# Patient Record
Sex: Male | Born: 1985 | Race: White | Hispanic: No | Marital: Married | Smoking: Former smoker
Health system: Southern US, Community
[De-identification: ages and names within clinical notes are randomized; demographics above are authoritative.]

## PROBLEM LIST (undated history)

## (undated) DIAGNOSIS — C801 Malignant (primary) neoplasm, unspecified: Secondary | ICD-10-CM

## (undated) DIAGNOSIS — Z923 Personal history of irradiation: Secondary | ICD-10-CM

## (undated) DIAGNOSIS — Z8672 Personal history of thrombophlebitis: Secondary | ICD-10-CM

## (undated) DIAGNOSIS — M352 Behcet's disease: Secondary | ICD-10-CM

## (undated) DIAGNOSIS — M109 Gout, unspecified: Secondary | ICD-10-CM

## (undated) DIAGNOSIS — C6211 Malignant neoplasm of descended right testis: Secondary | ICD-10-CM

## (undated) HISTORY — PX: APPENDECTOMY: SHX54

## (undated) HISTORY — DX: Gout, unspecified: M10.9

## (undated) HISTORY — DX: Behcet's disease: M35.2

## (undated) HISTORY — DX: Personal history of irradiation: Z92.3

---
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
## 2006-04-28 ENCOUNTER — Ambulatory Visit: Payer: Self-pay | Admitting: Family Medicine

## 2006-04-28 LAB — CONVERTED CEMR LAB
BUN: 3 mg/dL — ABNORMAL LOW (ref 6–23)
Basophils Absolute: 0 10*3/uL (ref 0.0–0.1)
Basophils Relative: 0.1 % (ref 0.0–1.0)
Blood Glucose, Fasting: 89 mg/dL
CO2: 30 meq/L (ref 19–32)
Calcium: 9.6 mg/dL (ref 8.4–10.5)
Chloride: 107 meq/L (ref 96–112)
Creatinine, Ser: 0.9 mg/dL (ref 0.4–1.5)
Eosinophils Absolute: 0.1 10*3/uL (ref 0.0–0.6)
Eosinophils Relative: 1.4 % (ref 0.0–5.0)
GFR calc Af Amer: 140 mL/min
GFR calc non Af Amer: 116 mL/min
Glucose, Bld: 89 mg/dL (ref 70–99)
HCT: 47.9 % (ref 39.0–52.0)
Hemoglobin: 16.2 g/dL (ref 13.0–17.0)
Lymphocytes Relative: 13.6 % (ref 12.0–46.0)
MCHC: 33.8 g/dL (ref 30.0–36.0)
MCV: 81.4 fL (ref 78.0–100.0)
Monocytes Absolute: 0.8 10*3/uL — ABNORMAL HIGH (ref 0.2–0.7)
Monocytes Relative: 7.9 % (ref 3.0–11.0)
Neutro Abs: 8.2 10*3/uL — ABNORMAL HIGH (ref 1.4–7.7)
Neutrophils Relative %: 77 % (ref 43.0–77.0)
Platelets: 244 10*3/uL (ref 150–400)
Potassium: 4.1 meq/L (ref 3.5–5.1)
RBC count: 5.89 10*6/uL
RBC: 5.89 M/uL — ABNORMAL HIGH (ref 4.22–5.81)
RDW: 12.1 % (ref 11.5–14.6)
Sodium: 142 meq/L (ref 135–145)
TSH: 0.39 microintl units/mL
TSH: 0.39 microintl units/mL (ref 0.35–5.50)
WBC, blood: 10.5 10*3/uL
WBC: 10.5 10*3/uL (ref 4.5–10.5)

## 2006-12-03 ENCOUNTER — Ambulatory Visit: Payer: Self-pay | Admitting: Family Medicine

## 2006-12-03 ENCOUNTER — Encounter (INDEPENDENT_AMBULATORY_CARE_PROVIDER_SITE_OTHER): Payer: Self-pay | Admitting: Internal Medicine

## 2006-12-08 ENCOUNTER — Telehealth (INDEPENDENT_AMBULATORY_CARE_PROVIDER_SITE_OTHER): Payer: Self-pay | Admitting: Internal Medicine

## 2006-12-08 LAB — CONVERTED CEMR LAB: Herpes Simplex Vrs I&II-IgM Ab (EIA): NOT DETECTED

## 2006-12-09 ENCOUNTER — Encounter: Payer: Self-pay | Admitting: Family Medicine

## 2006-12-09 ENCOUNTER — Encounter (INDEPENDENT_AMBULATORY_CARE_PROVIDER_SITE_OTHER): Payer: Self-pay | Admitting: Internal Medicine

## 2007-04-09 ENCOUNTER — Emergency Department: Payer: Self-pay | Admitting: Emergency Medicine

## 2007-04-13 ENCOUNTER — Ambulatory Visit: Payer: Self-pay | Admitting: Family Medicine

## 2007-04-14 ENCOUNTER — Telehealth: Payer: Self-pay | Admitting: Family Medicine

## 2007-04-19 ENCOUNTER — Ambulatory Visit: Payer: Self-pay | Admitting: Family Medicine

## 2007-04-20 ENCOUNTER — Encounter: Payer: Self-pay | Admitting: Family Medicine

## 2007-04-20 DIAGNOSIS — J309 Allergic rhinitis, unspecified: Secondary | ICD-10-CM | POA: Insufficient documentation

## 2007-04-20 DIAGNOSIS — M4120 Other idiopathic scoliosis, site unspecified: Secondary | ICD-10-CM | POA: Insufficient documentation

## 2007-07-15 ENCOUNTER — Ambulatory Visit: Payer: Self-pay | Admitting: Family Medicine

## 2007-07-15 DIAGNOSIS — S61209A Unspecified open wound of unspecified finger without damage to nail, initial encounter: Secondary | ICD-10-CM | POA: Insufficient documentation

## 2007-08-31 ENCOUNTER — Ambulatory Visit: Payer: Self-pay | Admitting: Family Medicine

## 2007-08-31 DIAGNOSIS — M352 Behcet's disease: Secondary | ICD-10-CM | POA: Insufficient documentation

## 2007-09-02 ENCOUNTER — Telehealth: Payer: Self-pay | Admitting: Family Medicine

## 2007-09-02 ENCOUNTER — Encounter: Payer: Self-pay | Admitting: Family Medicine

## 2007-09-06 ENCOUNTER — Telehealth: Payer: Self-pay | Admitting: Family Medicine

## 2007-09-08 ENCOUNTER — Telehealth (INDEPENDENT_AMBULATORY_CARE_PROVIDER_SITE_OTHER): Payer: Self-pay | Admitting: Internal Medicine

## 2007-09-23 ENCOUNTER — Telehealth: Payer: Self-pay | Admitting: Family Medicine

## 2007-10-12 ENCOUNTER — Encounter: Payer: Self-pay | Admitting: Family Medicine

## 2008-02-08 ENCOUNTER — Encounter: Payer: Self-pay | Admitting: Family Medicine

## 2008-05-11 ENCOUNTER — Telehealth: Payer: Self-pay | Admitting: Family Medicine

## 2008-05-12 ENCOUNTER — Ambulatory Visit: Payer: Self-pay | Admitting: Family Medicine

## 2008-05-12 ENCOUNTER — Telehealth: Payer: Self-pay | Admitting: Family Medicine

## 2008-05-12 DIAGNOSIS — R17 Unspecified jaundice: Secondary | ICD-10-CM | POA: Insufficient documentation

## 2008-05-12 LAB — CONVERTED CEMR LAB
Haptoglobin: 151 mg/dL (ref 16–200)
LDH: 119 units/L (ref 94–250)

## 2008-05-15 LAB — CONVERTED CEMR LAB
ALT: 18 units/L (ref 0–53)
AST: 16 units/L (ref 0–37)
Albumin: 3.8 g/dL (ref 3.5–5.2)
Alkaline Phosphatase: 71 units/L (ref 39–117)
BUN: 8 mg/dL (ref 6–23)
Basophils Absolute: 0 10*3/uL (ref 0.0–0.1)
Basophils Relative: 0.1 % (ref 0.0–3.0)
Bilirubin, Direct: 0.2 mg/dL (ref 0.0–0.3)
CO2: 31 meq/L (ref 19–32)
Calcium: 8.7 mg/dL (ref 8.4–10.5)
Chloride: 105 meq/L (ref 96–112)
Creatinine, Ser: 0.9 mg/dL (ref 0.4–1.5)
Eosinophils Absolute: 0.2 10*3/uL (ref 0.0–0.7)
Eosinophils Relative: 2.8 % (ref 0.0–5.0)
GFR calc Af Amer: 137 mL/min
GFR calc non Af Amer: 113 mL/min
Glucose, Bld: 79 mg/dL (ref 70–99)
HCT: 45.1 % (ref 39.0–52.0)
Hemoglobin: 15.5 g/dL (ref 13.0–17.0)
Lymphocytes Relative: 24.8 % (ref 12.0–46.0)
MCHC: 34.4 g/dL (ref 30.0–36.0)
MCV: 83 fL (ref 78.0–100.0)
Monocytes Absolute: 0.7 10*3/uL (ref 0.1–1.0)
Monocytes Relative: 11.4 % (ref 3.0–12.0)
Neutro Abs: 3.5 10*3/uL (ref 1.4–7.7)
Neutrophils Relative %: 60.9 % (ref 43.0–77.0)
Platelets: 233 10*3/uL (ref 150–400)
Potassium: 3.9 meq/L (ref 3.5–5.1)
RBC: 5.44 M/uL (ref 4.22–5.81)
RDW: 12.6 % (ref 11.5–14.6)
Sodium: 142 meq/L (ref 135–145)
Total Bilirubin: 1.6 mg/dL — ABNORMAL HIGH (ref 0.3–1.2)
Total Protein: 6.5 g/dL (ref 6.0–8.3)
WBC: 5.8 10*3/uL (ref 4.5–10.5)

## 2008-05-29 ENCOUNTER — Ambulatory Visit: Payer: Self-pay | Admitting: Family Medicine

## 2008-05-29 LAB — CONVERTED CEMR LAB
ALT: 19 units/L (ref 0–53)
AST: 20 units/L (ref 0–37)
Albumin: 4 g/dL (ref 3.5–5.2)
Alkaline Phosphatase: 81 units/L (ref 39–117)
BUN: 10 mg/dL (ref 6–23)
Basophils Absolute: 0 10*3/uL (ref 0.0–0.1)
Basophils Relative: 0.3 % (ref 0.0–3.0)
Bilirubin, Direct: 0.3 mg/dL (ref 0.0–0.3)
CO2: 31 meq/L (ref 19–32)
CRP, High Sensitivity: 3 (ref 0.00–5.00)
Calcium: 9.4 mg/dL (ref 8.4–10.5)
Chloride: 109 meq/L (ref 96–112)
Cholesterol: 122 mg/dL (ref 0–200)
Creatinine, Ser: 1 mg/dL (ref 0.4–1.5)
Eosinophils Absolute: 0.1 10*3/uL (ref 0.0–0.7)
Eosinophils Relative: 1.9 % (ref 0.0–5.0)
GFR calc non Af Amer: 99.29 mL/min (ref >60)
Glucose, Bld: 95 mg/dL (ref 70–99)
HCT: 45.8 % (ref 39.0–52.0)
HDL: 46.6 mg/dL (ref >39.00)
Hemoglobin: 15.8 g/dL (ref 13.0–17.0)
LDL Cholesterol: 70 mg/dL (ref 0–99)
Lymphocytes Relative: 29.9 % (ref 12.0–46.0)
Lymphs Abs: 2.1 10*3/uL (ref 0.7–4.0)
MCHC: 34.4 g/dL (ref 30.0–36.0)
MCV: 82.5 fL (ref 78.0–100.0)
Monocytes Absolute: 0.6 10*3/uL (ref 0.1–1.0)
Monocytes Relative: 9 % (ref 3.0–12.0)
Neutro Abs: 4.1 10*3/uL (ref 1.4–7.7)
Neutrophils Relative %: 58.9 % (ref 43.0–77.0)
Platelets: 251 10*3/uL (ref 150.0–400.0)
Potassium: 4.2 meq/L (ref 3.5–5.1)
RBC: 5.55 M/uL (ref 4.22–5.81)
RDW: 12.7 % (ref 11.5–14.6)
Sed Rate: 4 mm/hr (ref 0–22)
Sodium: 144 meq/L (ref 135–145)
TSH: 0.68 microintl units/mL (ref 0.35–5.50)
Total Bilirubin: 2 mg/dL — ABNORMAL HIGH (ref 0.3–1.2)
Total CHOL/HDL Ratio: 3
Total Protein: 6.8 g/dL (ref 6.0–8.3)
Triglycerides: 28 mg/dL (ref 0.0–149.0)
VLDL: 5.6 mg/dL (ref 0.0–40.0)
WBC: 6.9 10*3/uL (ref 4.5–10.5)

## 2008-06-01 ENCOUNTER — Ambulatory Visit: Payer: Self-pay | Admitting: Family Medicine

## 2008-06-01 DIAGNOSIS — M25569 Pain in unspecified knee: Secondary | ICD-10-CM

## 2008-07-18 ENCOUNTER — Encounter: Payer: Self-pay | Admitting: Family Medicine

## 2008-08-25 ENCOUNTER — Ambulatory Visit: Payer: Self-pay | Admitting: Family Medicine

## 2008-09-11 ENCOUNTER — Encounter: Payer: Self-pay | Admitting: Family Medicine

## 2008-09-11 ENCOUNTER — Telehealth: Payer: Self-pay | Admitting: Family Medicine

## 2008-10-10 ENCOUNTER — Encounter: Payer: Self-pay | Admitting: Family Medicine

## 2008-10-21 ENCOUNTER — Emergency Department: Payer: Self-pay | Admitting: Emergency Medicine

## 2008-10-25 ENCOUNTER — Ambulatory Visit: Payer: Self-pay | Admitting: Family Medicine

## 2008-10-25 DIAGNOSIS — I808 Phlebitis and thrombophlebitis of other sites: Secondary | ICD-10-CM

## 2008-11-15 ENCOUNTER — Telehealth: Payer: Self-pay | Admitting: Family Medicine

## 2008-11-16 ENCOUNTER — Ambulatory Visit: Payer: Self-pay | Admitting: Internal Medicine

## 2008-11-22 ENCOUNTER — Ambulatory Visit: Payer: Self-pay | Admitting: Family Medicine

## 2008-11-23 ENCOUNTER — Encounter: Payer: Self-pay | Admitting: Family Medicine

## 2008-11-24 ENCOUNTER — Encounter: Payer: Self-pay | Admitting: Family Medicine

## 2009-01-16 ENCOUNTER — Encounter: Payer: Self-pay | Admitting: Family Medicine

## 2009-01-21 ENCOUNTER — Emergency Department: Payer: Self-pay | Admitting: Emergency Medicine

## 2009-02-01 ENCOUNTER — Encounter: Payer: Self-pay | Admitting: Family Medicine

## 2009-03-09 ENCOUNTER — Telehealth: Payer: Self-pay | Admitting: Family Medicine

## 2009-03-19 ENCOUNTER — Encounter (INDEPENDENT_AMBULATORY_CARE_PROVIDER_SITE_OTHER): Payer: Self-pay | Admitting: *Deleted

## 2009-03-19 ENCOUNTER — Ambulatory Visit: Payer: Self-pay | Admitting: Family Medicine

## 2009-03-19 ENCOUNTER — Encounter: Payer: Self-pay | Admitting: Family Medicine

## 2009-03-19 DIAGNOSIS — S60459A Superficial foreign body of unspecified finger, initial encounter: Secondary | ICD-10-CM

## 2009-03-19 DIAGNOSIS — M79609 Pain in unspecified limb: Secondary | ICD-10-CM

## 2009-03-22 ENCOUNTER — Encounter (INDEPENDENT_AMBULATORY_CARE_PROVIDER_SITE_OTHER): Payer: Self-pay | Admitting: *Deleted

## 2009-03-26 ENCOUNTER — Telehealth: Payer: Self-pay | Admitting: Family Medicine

## 2009-04-05 ENCOUNTER — Ambulatory Visit: Payer: Self-pay | Admitting: Family Medicine

## 2009-04-12 ENCOUNTER — Ambulatory Visit: Payer: Self-pay | Admitting: Family Medicine

## 2009-05-08 ENCOUNTER — Encounter: Payer: Self-pay | Admitting: Family Medicine

## 2009-05-08 LAB — CONVERTED CEMR LAB
Basophils Absolute: 0 10*3/uL (ref 0.0–0.1)
Basophils Relative: 0 % (ref 0.0–3.0)
Eosinophils Absolute: 0.1 10*3/uL (ref 0.0–0.7)
Eosinophils Relative: 1.4 % (ref 0.0–5.0)
HCT: 44.2 % (ref 39.0–52.0)
Hemoglobin: 14.2 g/dL (ref 13.0–17.0)
Lymphocytes Relative: 13.6 % (ref 12.0–46.0)
Lymphs Abs: 1.2 10*3/uL (ref 0.7–4.0)
MCHC: 32.1 g/dL (ref 30.0–36.0)
MCV: 82.7 fL (ref 78.0–100.0)
Monocytes Absolute: 0.7 10*3/uL (ref 0.1–1.0)
Monocytes Relative: 7.8 % (ref 3.0–12.0)
Neutro Abs: 6.5 10*3/uL (ref 1.4–7.7)
Neutrophils Relative %: 77.2 % — ABNORMAL HIGH (ref 43.0–77.0)
Platelets: 223 10*3/uL (ref 150.0–400.0)
RBC: 5.34 M/uL (ref 4.22–5.81)
RDW: 13.7 % (ref 11.5–14.6)
WBC: 8.5 10*3/uL (ref 4.5–10.5)

## 2009-06-07 ENCOUNTER — Ambulatory Visit: Payer: Self-pay | Admitting: Family Medicine

## 2009-06-07 ENCOUNTER — Encounter (INDEPENDENT_AMBULATORY_CARE_PROVIDER_SITE_OTHER): Payer: Self-pay | Admitting: *Deleted

## 2009-06-07 DIAGNOSIS — M25469 Effusion, unspecified knee: Secondary | ICD-10-CM

## 2009-06-12 LAB — CONVERTED CEMR LAB
Crystals, Fluid: NONE SEEN
Glucose, Synovial Fluid: 61 mg/dL
Monocyte/Macrophage: 3 % — ABNORMAL LOW (ref 50–90)
Neutrophil, Synovial: 93 % — ABNORMAL HIGH (ref 0–25)
WBC, Synovial: 33445 — ABNORMAL HIGH (ref 0–200)

## 2009-09-17 ENCOUNTER — Ambulatory Visit: Payer: Self-pay | Admitting: Family Medicine

## 2009-09-18 LAB — CONVERTED CEMR LAB
Basophils Absolute: 0 10*3/uL (ref 0.0–0.1)
Basophils Relative: 0.6 % (ref 0.0–3.0)
Eosinophils Absolute: 0.1 10*3/uL (ref 0.0–0.7)
Eosinophils Relative: 2.2 % (ref 0.0–5.0)
HCT: 44 % (ref 39.0–52.0)
Hemoglobin: 15 g/dL (ref 13.0–17.0)
Lymphocytes Relative: 31 % (ref 12.0–46.0)
Lymphs Abs: 1.8 10*3/uL (ref 0.7–4.0)
MCHC: 34 g/dL (ref 30.0–36.0)
MCV: 81.5 fL (ref 78.0–100.0)
Monocytes Absolute: 0.4 10*3/uL (ref 0.1–1.0)
Monocytes Relative: 7.2 % (ref 3.0–12.0)
Neutro Abs: 3.4 10*3/uL (ref 1.4–7.7)
Neutrophils Relative %: 59 % (ref 43.0–77.0)
Platelets: 279 10*3/uL (ref 150.0–400.0)
RBC: 5.41 M/uL (ref 4.22–5.81)
RDW: 14.3 % (ref 11.5–14.6)
WBC: 5.7 10*3/uL (ref 4.5–10.5)

## 2009-10-03 ENCOUNTER — Telehealth: Payer: Self-pay | Admitting: Family Medicine

## 2009-10-11 ENCOUNTER — Ambulatory Visit: Payer: Self-pay | Admitting: Family Medicine

## 2009-10-11 LAB — CONVERTED CEMR LAB
Basophils Absolute: 0 10*3/uL (ref 0.0–0.1)
Basophils Relative: 0.5 % (ref 0.0–3.0)
Eosinophils Absolute: 0.1 10*3/uL (ref 0.0–0.7)
Eosinophils Relative: 1.7 % (ref 0.0–5.0)
HCT: 45.1 % (ref 39.0–52.0)
Hemoglobin: 15.5 g/dL (ref 13.0–17.0)
Lymphocytes Relative: 21.3 % (ref 12.0–46.0)
Lymphs Abs: 1.8 10*3/uL (ref 0.7–4.0)
MCHC: 34.4 g/dL (ref 30.0–36.0)
MCV: 81.8 fL (ref 78.0–100.0)
Monocytes Absolute: 0.5 10*3/uL (ref 0.1–1.0)
Monocytes Relative: 5.6 % (ref 3.0–12.0)
Neutro Abs: 5.9 10*3/uL (ref 1.4–7.7)
Neutrophils Relative %: 70.9 % (ref 43.0–77.0)
Platelets: 259 10*3/uL (ref 150.0–400.0)
RBC: 5.51 M/uL (ref 4.22–5.81)
RDW: 14.4 % (ref 11.5–14.6)
WBC: 8.4 10*3/uL (ref 4.5–10.5)

## 2009-12-14 ENCOUNTER — Ambulatory Visit: Payer: Self-pay | Admitting: Family Medicine

## 2009-12-14 DIAGNOSIS — M25539 Pain in unspecified wrist: Secondary | ICD-10-CM | POA: Insufficient documentation

## 2010-04-02 NOTE — Assessment & Plan Note (Signed)
Summary: fell on wrist/dlo   Vital Signs:  Patient profile:   25 year old male Height:      69 inches Weight:      152.25 pounds BMI:     22.56 Temp:     98.7 degrees F oral Pulse rate:   96 / minute Pulse rhythm:   regular BP sitting:   110 / 72  (left arm) Cuff size:   regular  Vitals Entered By: Lewanda Rife LPN (December 14, 2009 3:28 PM) CC: fell on left wrist this am at 4:30 am and again at 9:30am. Pain level now is 4.   History of Present Illness: Fell onto L wrist this AM.  FOOSH.  Pain at that point.  Larey Seat again at work today, [employer] again, increase pain since then.  Pain is 4/10 now.  Pain near distal ulna, worse with activity. Had been icing it down.    decrease in range of motion , flex/ext/ulnar deviation due to pain.   Allergies: 1)  ! Morphine  Review of Systems       See HPI.  Otherwise negative.    Physical Exam  General:  NAD normal range of motion at elbow.  L wrist with tenderness on flex/ext/ulnar deviation.  tender to palpation distally on ulna, no snuffbox tenderness. distally NV intact and able to make composite fist.    Impression & Recommendations:  Problem # 1:  WRIST PAIN, LEFT (ICD-719.43) Xrays neg.  Feels better in brace.  Work note given for light duty and allowed to wear brace.  follow up as needed- he is aware for possible occult fx that isn't visible on initial films.  Sedation caution given for meds.  Orders: T-Wrist Comp Left Min 3 Views ([id number])  Complete Medication List: 1)  Eq Ibuprofen 200 Mg Caps (Ibuprofen) .... As needed 2)  Allegra 180 Mg Tabs (Fexofenadine hcl) .... Take 1 tablet by mouth once a day as needed 3)  Azathioprine 50 Mg Tabs (Azathioprine) .... Take three by mouth daily 4)  Vicodin 5-500 Mg Tabs (Hydrocodone-acetaminophen) .Marland Kitchen.. 1-2 by mouth three times a day as needed pain with sedation caution  Patient Instructions: 1)  follow up as needed. Pt agrees.   Current Allergies (reviewed today): ! MORPHINE

## 2010-04-02 NOTE — Progress Notes (Signed)
Summary: time for blood work?  Phone Note Call from Patient Call back at [facility] Phone [phone]   Caller: Patient Call For: Dr. Dayton Martes Summary of Call: Pt is asking if it is time for him to get his blood count checked.  He said his doctor in [place] increased his dosage on his azathioprine about a week ago. Initial call taken by: Lowella Petties CMA,  October 03, 2009 12:15 PM  Follow-up for Phone Call        yes please recheck CBC (282.03) Ruthe Mannan MD  October 03, 2009 1:48 PM  Advised pt, lab appt made. Follow-up by: Lowella Petties CMA,  October 03, 2009 4:19 PM

## 2010-04-02 NOTE — Progress Notes (Signed)
Summary: Chest pain  Phone Note Call from Patient Call back at [phone]   Caller: Patient Call For: Shaune Leeks MD Summary of Call: Patient fell last week on ice and came in and saw Dr. Patsy Lager and the main problem at that time was his thumb. Patient states that his left side hurts just below the left breast and around into the back, area hurts when he breathes, any lifting with the left arm hurts, no SOB. The pain has been gradually getting worse each day and even hurts when he walks today. Patient has an appt. scheduled with Dr. Milinda Antis on Wednesday, but patient wants to know what he should do until that appt? Initial call taken by: Sydell Axon LPN,  March 26, 2009 1:05 PM  Follow-up for Phone Call        Because of his Behcet's and prior concern over PE, think he should be seen at [facility] ER to insure not clotting problem. I would not wait until Wed. Follow-up by: Shaune Leeks MD,  March 26, 2009 1:14 PM  Additional Follow-up for Phone Call Additional follow up Details #1::        Patient notified as instructed by telephone. Patient said that he will go to [facility] ER now.  Additional Follow-up by: Sydell Axon LPN,  March 26, 2009 1:18 PM

## 2010-04-02 NOTE — Assessment & Plan Note (Signed)
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  the patient presents with a known  rheumatological disorder, noted above, with a right knee effusion after no known traumatic incident. He has had no accident. He is not twisted his knee or had any other interval problem including no trauma.  He is never had a knee effusion such as this. He is having some mild pain, primarily with forced flexion. The knee is not hot, it is not red.  He does have a few pimples on his right leg.  Recently saw Dr. Stoney Bang 1 month ago.   Allergies: 1)  ! Morphine  Past History:  Past medical, surgical, family and social histories (including risk factors) reviewed, and no changes noted (except as noted below).  Past Medical History: Reviewed history from 05/12/2008 and no changes required. Allergic rhinitis(:11/1997) Behcet's syndrome  Past Surgical History: Reviewed history from 04/20/2007 and no changes required. BROKEN NOSE PLAYING BASEBALL ECHO--NORMAL:(12/1997) ADNEOIDS REMOVED  71YEARS OF ZOX:(0960) APPY 10 AVW:(0981) JAW SURGERY FOR OVERBITE :(02/2004) FAMILY HISTORY OF CARDIOMYOPATHY (FATHER)  Family History: Reviewed history from 06/01/2008 and no changes required. Father: A  46 Mother: A 58 BROTHER A ([name]) 23 SISTER A ([name]) 16 Brother A [name] 11 CV: +PGF MI HBP: +PGF DM: +MGM/ +MGF// +PGM// +PGF GOUT/ARTHRITIS: PROSTATE CANCER:  BREAST/OVARIAN/UTERINE CANCER:NEGATIVE COLON CANCER: DEPRESSION: NEGATIVE ETOH/DRUG ABUSE:  NEGATIVE OTHER: NEGATIVE STROKE  Social History: Reviewed history from 06/01/2008 and no changes required. Marital Status: Single Last semester at [employer] Children: 0 Occupation: Engineer, technical sales, Cook/server  Review of Systems       REVIEW OF SYSTEMS  GEN: No systemic complaints, no fevers, chills, sweats, or other acute illnesses MSK: Detailed in the HPI GI: tolerating PO intake without difficulty Neuro: No numbness, parasthesias, or tingling associated. Otherwise the pertinent positives of the ROS are noted above.    Physical Exam  General:  GEN: Well-developed,well-nourished,in no acute distress; alert,appropriate and cooperative throughout examination HEENT: Normocephalic and atraumatic without obvious abnormalities. No apparent alopecia or balding. Ears, externally no deformities PULM: Breathing comfortably in no respiratory distress EXT: No clubbing, cyanosis, or edema PSYCH: Normally interactive. Cooperative during the interview. Pleasant. Friendly and conversant. Not anxious or depressed appearing. Normal, full affect.  Msk:  L knee: full ROM, no effusion  R knee: nontender to palpation throughout.  Large ballotable effusion. Nontender along the tibia and fibula. Nontender with patellar facet loading.  Stable to varus and valgus stress. Negative Lachman. Negative anterior and posterior drawer test.  Meniscal testing is equivocal given large effusion.  Negative bounce home test.  No redness or warmth.   Impression & Recommendations:  Problem # 1:  JOINT EFFUSION, RIGHT KNEE (ICD-719.06) Assessment New  large effusion, unclear cause. 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  We'll aspirate joint fluid to rule out potential infection.  At this point the Gram stain is  negative. With appearance of joint aspirate suspect rheum cause, less likely gout, cannot rule out ID, but does not clinically appear to have septic joint.  If culture is negative, will use oral  prednisone taper and have him f/u with [facility]  Knee Aspiration and Injection, RIGHT Patient verbally consented; risks, benefits, and alternatives explained. Patient prepped with betadine. Ethyl chloride for anesthesia. 10 cc of 1% Lidocaine used in wheal then injected Subcutaneous fashion with 27 gauge needle on lateral approach. Under sterilne conditions, 18 gauge needle used via lateral approach to aspirate 25 cc of cloudy, yellowish fluid. Then 9 cc of Marcaine 0.5% and 1 cc of Kenalog 40 mg injected. Tolerated well, decreased pain, no complications.   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 Laboratory test [phone]) Joint Aspirate / Injection, Large (20610)  Complete Medication List: 1)  Eq Ibuprofen 200 Mg Caps (Ibuprofen) .... As needed 2)  Allegra 180 Mg Tabs (Fexofenadine hcl) .... Take 1 tablet by mouth once a day 3)  Azathioprine 50 Mg Tabs (Azathioprine) .... Take one by mouth daily  Current Allergies (reviewed today): ! MORPHINE

## 2010-04-02 NOTE — Letter (Signed)
Summary: Dr.Norton [facility] Rheumatology,Note  Dr.Norton [facility] Rheumatology,Note   Imported By: Beau Fanny 05/10/2009 15:03:42  _____________________________________________________________________  External Attachment:    Type:   Image     Comment:   External Document

## 2010-04-02 NOTE — Assessment & Plan Note (Signed)
Summary: 10:15 FELL AND PIECE OF WOOD IN HAND/RI   Vital Signs:  Patient profile:   25 year old male Height:      69 inches Weight:      143 pounds BMI:     21.19 Temp:     98.2 degrees F oral Pulse rate:   60 / minute Pulse rhythm:   regular BP sitting:   120 / 78  (left arm) Cuff size:   regular  Vitals Entered By: Daniel Vang CMA Daniel Vang) (March 19, 2009 10:21 AM)  History of Present Illness: Chief complaint fell and got a piece of wood in thumb  R thumb, foreign body  25 year old healthy gentleman who presents after falling and having a large piece of wood go through the volar aspect of his first digit. This is angled and entered approximately at the I P. joint, going towards the distal end. He is having minimal pain at this point. Sensation is intact.  Td 2008   Allergies: 1)  ! Morphine  Past History:  Past medical, surgical, family and social histories (including risk factors) reviewed, and no changes noted (except as noted below).  Past Medical History: Reviewed history from 05/12/2008 and no changes required. Allergic rhinitis(:11/1997) Behcet's syndrome  Past Surgical History: Reviewed history from 04/20/2007 and no changes required. BROKEN NOSE PLAYING BASEBALL ECHO--NORMAL:(12/1997) ADNEOIDS REMOVED  28YEARS OF ZOX:(0960) APPY 10 AVW:(0981) JAW SURGERY FOR OVERBITE :(02/2004) FAMILY HISTORY OF CARDIOMYOPATHY (FATHER)  Family History: Reviewed history from 06/01/2008 and no changes required. Father: A  39 Mother: A 57 BROTHER A (Daniel Vang) 23 SISTER A (Daniel Vang) 16 Brother A [name] 11 CV: +PGF MI HBP: +PGF DM: +MGM/ +MGF// +PGM// +PGF GOUT/ARTHRITIS: PROSTATE CANCER:  BREAST/OVARIAN/UTERINE CANCER:NEGATIVE COLON CANCER: DEPRESSION: NEGATIVE ETOH/DRUG ABUSE: NEGATIVE OTHER: NEGATIVE STROKE  Social History: Reviewed history from 06/01/2008 and no changes required. Marital Status: Single Last semester at [employer] Children: 0 Occupation: Games developer, Cook/server  Review of Systems       REVIEW OF SYSTEMS  GEN: No systemic complaints, no fevers, chills, sweats, or other acute illnesses MSK: Detailed in the HPI GI: tolerating PO intake without difficulty Neuro: No numbness, parasthesias, or tingling associated. Otherwise the pertinent positives of the ROS are noted above.    Physical Exam  General:  GEN: Well-developed,well-nourished,in no acute distress; alert,appropriate and cooperative throughout examination HEENT: Normocephalic and atraumatic without obvious abnormalities. No apparent alopecia or balding. Ears, externally no deformities PULM: Breathing comfortably in no respiratory distress EXT: No clubbing, cyanosis, or edema PSYCH: Normally interactive. Cooperative during the interview. Pleasant. Friendly and conversant. Not anxious or depressed appearing. Normal, full affect.  Msk:  Full ROM at 1st digit Extremities:  Large piece of wood, R 1st volar aspect   Impression & Recommendations:  Problem # 1:  THUMB PAIN, RIGHT (ICD-729.5) Assessment New XR, finger, 1st Indication: FB, pain no fb, no fx  supportive care, tylenol  Orders: Radiology other (Radiology Other)  Problem # 2:  FOREIGN BODY, FINGER (ICD-915.6) Assessment: New  DOI, 03/19/2009  Complex foreign body removal, imbedded 1 inch into the volar aspect of finger requiring incision with #15 blade and removal of embedded foreign body  See procedure note  Verbal and written consent. Anesth with 8 cc of Lidocaine 1%. Ring block. Prepped with betadine. FB, wood removed after small incision at base of insertion without difficulty. Wound flushed and irrigated. No complications.  Orders: Removal Foreign Body Complicated (10121)  Complete Medication List: 1)  Eq Ibuprofen 200 Mg  Caps (Ibuprofen) .... As needed 2)  Allegra 180 Mg Tabs (Fexofenadine hcl) .... Take 1 tablet by mouth once a day 3)  Colchicine 0.6 Mg Tabs (Colchicine) .Marland Kitchen.. 1 twice a day  by mouth 4)  Augmentin 875-125 Mg Tabs (Amoxicillin-pot clavulanate) .Marland Kitchen.. 1 by mouth two times a day Prescriptions: AUGMENTIN 875-125 MG TABS (AMOXICILLIN-POT CLAVULANATE) 1 by mouth two times a day  #20 x 0   Entered and Authorized by:   Daniel Beat MD   Signed by:   Daniel Beat MD on 03/19/2009   Method used:   Electronically to        [facility]ail[facility]       [address]       [place], [place]  [address]       Ph: [phone]       Fax: [phone]   RxID:   [id number]   Current Allergies (reviewed today): ! MORPHINE

## 2010-04-02 NOTE — Miscellaneous (Signed)
Summary: Consent for Foreign Body Removal  Consent for Foreign Body Removal   Imported By: Beau Fanny 03/19/2009 15:47:41  _____________________________________________________________________  External Attachment:    Type:   Image     Comment:   External Document

## 2010-04-02 NOTE — Assessment & Plan Note (Signed)
Summary: F/U SIDE PAIN/CLE   Vital Signs:  Patient profile:   25 year old male Weight:      145 pounds Temp:     98.5 degrees F oral Pulse rate:   68 / minute Pulse rhythm:   regular BP sitting:   112 / 68  (left arm) Cuff size:   regular  Vitals Entered By: Sydell Axon LPN (April 05, 2009 10:11 AM) CC: Follow-up on side pain   History of Present Illness: Pt went to [facility] Acute care day after phone call here and "they ran some tests and said i wasn't a blood clot". He had U/S of chest (ECHO?) and told that he did not have a clot. Since then it has continued to hurt. This started with falling. He fell/slipped on a patch of ice and fell on the left side of his back. The initial discomfort has gotten a little worse. Initially it was only with movement, now hurts with certain positions and things like going up stairs hurts from the jostling that happens. He had no area of echymosis. No fever or chills, does not cause SOB. But he does need to keep his breathing shalklow, deep breaths cause discomfort. He sleeps ok but requires position not to hurt.  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 As Needed 2)  Allegra 180 Mg Tabs (Fexofenadine Hcl) .... Take 1 Tablet By Mouth Once A Day 3)  Colchicine 0.6 Mg Tabs (Colchicine) .Marland Kitchen.. 1 Twice A Day By Mouth 4)  Augmentin 875-125 Mg Tabs (Amoxicillin-Pot Clavulanate) .Marland Kitchen.. 1 By Mouth Two Times A Day  Allergies: 1)  !  Morphine  Physical Exam  General:  Well-developed,well-nourished,in no acute distress; alert,appropriate and cooperative throughout examination, interactive and nontoxic. Head:  Normocephalic and atraumatic without obvious abnormalities. No apparent alopecia or balding. Eyes:  Conjunctiva clear bilaterally. Ears:  no external deformities.   Nose:  no external deformity.   Mouth:  Oral mucosa and oropharynx with few ulcers noted. Neck:  No deformities, masses, or tenderness noted. Lungs:  Normal respiratory effort, chest expands symmetrically. Lungs are clear to auscultation, no crackles or wheezes. Heart:  Normal rate and regular rhythm. S1 and S2 normal without gallop, murmur, click, rub or other extra sounds. Msk:  Discomfort with raising armas and general movement usin Pectoralis and Lat Dorsi muscles, no echymosis or swelling noted.   Impression & Recommendations:  Problem # 1:  CHEST PAIN UNSPECIFIED, MUSC PULL (ICD-786.50) Assessment New Muscle pull suspected Left Pec and Lat Dorsi....discussed chronicity and 6 weeks minimum to heal. Take IBOP as able 1 week then off 3-4 days for one month or more. Use heat and ice as discussed. Give time.  Complete Medication List: 1)  Eq Ibuprofen 200 Mg Caps (Ibuprofen) .... As needed 2)  Allegra 180 Mg Tabs (Fexofenadine hcl) .... Take 1 tablet by mouth once a day 3)  Colchicine 0.6 Mg Tabs (Colchicine) .Marland Kitchen.. 1 twice a day by mouth 4)  Azathioprine 50 Mg Tabs (Azathioprine) .... Take one by mouth daily  Patient Instructions: 1)  RTC if worsens.  Current Allergies (reviewed today): ! MORPHINE

## 2010-04-02 NOTE — Letter (Signed)
Summary: [facility] Rheumatology  [facility] Rheumatology   Imported By: Lanelle Bal 05/15/2009 09:20:01  _____________________________________________________________________  External Attachment:    Type:   Image     Comment:   External Document

## 2010-04-02 NOTE — Letter (Signed)
Summary: [place] No Show Letter  [place] at [facility]  [address] [place], [place] [address]   Phone: [phone]  Fax: [phone]    03/22/2009 MRN: [id number]  [facility] [address] [place], [place]  [address]   Dear Mr. [place],   Our records indicate that you missed your scheduled appointment with ___lab__________________ on __1.20.11__________.  Please contact this office to reschedule your appointment as soon as possible.  It is important that you keep your scheduled appointments with your physician, so we can provide you the best care possible.  Please be advised that there may be a charge for "no show" appointments.    Sincerely,    at [facility]

## 2010-04-02 NOTE — Assessment & Plan Note (Signed)
Summary: ROA FOR FOLLOW-UP FROM DR.SCHALLER AND WANTS TO ESTABLISH WIT.[facility]   Vital Signs:  Patient profile:   25 year old male Weight:      149.38 pounds Temp:     98.7 degrees F oral Pulse rate:   80 / minute Pulse rhythm:   regular BP sitting:   100 / 70  (left arm) Cuff size:   regular  Vitals Entered ByJanee Morn CMA (September 17, 2009 12:18 PM) CC: Follow up; Establish from Dr. Hetty Ely   History of Present Illness: 25 yo here to establish care with me.  Was seeing Dr. Hetty Ely.  Bechet's sydrome- followed by [facility].  Mainly gets sores in mouth, groin area.  Has recurrent knee effusions as well, none recently.  Now on Azathioprine 50 mg and symptoms well controlled.  Needs CBCs checked routinely due to possible drug induced neutropenia.  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  On his feet for many hours.  Kees, feet and ankles tend to hurt after long shifts.  No swelling, injuries, instability or other issues.     Current Medications (verified): 1)  Eq Ibuprofen 200 Mg  Caps (Ibuprofen) .... As Needed 2)  Allegra 180 Mg Tabs (Fexofenadine Hcl) .... Take 1 Tablet By Mouth Once A Day 3)  Azathioprine 50 Mg Tabs (Azathioprine) .... Take One By Mouth Daily  Allergies: 1)  ! Morphine  Past History:  Past Medical History: Last updated: 05/12/2008 Allergic rhinitis(:11/1997) Behcet's syndrome  Past Surgical History: Last updated: 04/20/2007 BROKEN NOSE PLAYING BASEBALL ECHO--NORMAL:(12/1997) ADNEOIDS REMOVED  86YEARS OF GUY:(4034) APPY 10 VQQ:(5956) JAW SURGERY FOR OVERBITE :(02/2004) FAMILY HISTORY OF CARDIOMYOPATHY (FATHER)  Family History: Last updated: 06/01/2008 Father: A  27 Mother: A 44 BROTHER A ([name]) 23 SISTER A ([name]) 16 Brother A [name] 11 CV: +PGF MI HBP: +PGF DM: +MGM/ +MGF// +PGM// +PGF GOUT/ARTHRITIS: PROSTATE CANCER:  BREAST/OVARIAN/UTERINE CANCER:NEGATIVE COLON CANCER: DEPRESSION: NEGATIVE ETOH/DRUG ABUSE: NEGATIVE OTHER:  NEGATIVE STROKE  Social History: Last updated: 06/01/2008 Marital Status: Single Last semester at [employer] Children: 0 Occupation: Engineer, technical sales, Cook/server  Risk Factors: Alcohol Use: 1 once a month (06/01/2008) Caffeine Use: 4 (06/01/2008) Exercise: no (06/01/2008)  Risk Factors: Smoking Status: quit (06/01/2008)  Review of Systems      See HPI General:  Denies fatigue, malaise, weakness, and weight loss. ENT:  Denies difficulty swallowing. CV:  Denies chest pain or discomfort. Resp:  Denies shortness of breath. GI:  Denies abdominal pain, bloody stools, and change in bowel habits. MS:  Complains of joint pain; denies joint redness and joint swelling. Derm:  Denies rash. Neuro:  Denies headaches. Psych:  Denies anxiety and depression.  Physical Exam  General:  GEN: Well-developed,well-nourished,in no acute distress; alert,appropriate and cooperative throughout examination HEENT: Normocephalic and atraumatic without obvious abnormalities. No apparent alopecia or balding. Ears, externally no deformities PULM: Breathing comfortably in no respiratory distress EXT: No clubbing, cyanosis, or edema PSYCH: Normally interactive. Cooperative during the interview. Pleasant. Friendly and conversant. Not anxious or depressed appearing. Normal, full affect.  Msk:  L knee: full ROM, no effusion  R knee: full ROM, no effusion  Stable to varus and valgus stress. Negative Lachman. Negative anterior and posterior drawer test.  Meniscal testing is equivocal given large effusion.  Negative bounce home test.  Ankles- FROM bilaterally, no instability  No redness or warmth.   Impression & Recommendations:  Problem # 1:  BEHCETS SYNDROME (ICD-136.1) Assessment Unchanged per [facility], will check CBC today.  Send results  to [facility].  Problem # 2:  KNEE PAIN, BILATERAL (ICD-719.46) Assessment: Unchanged No effusions.  Advised Ibuprofen and wearing supportive shoes. His updated medication list  for this problem includes:    Eq Ibuprofen 200 Mg Caps (Ibuprofen) .Marland Kitchen... As needed  Complete Medication List: 1)  Eq Ibuprofen 200 Mg Caps (Ibuprofen) .... As needed 2)  Allegra 180 Mg Tabs (Fexofenadine hcl) .... Take 1 tablet by mouth once a day 3)  Azathioprine 50 Mg Tabs (Azathioprine) .... Take one by mouth daily  Other Orders: Venipuncture ([address]) TLB-CBC Platelet - w/Differential (85025-[id number])  Patient Instructions: 1)  Take 400-600 mg of Ibuprofen (Advil, Motrin) with food every 4-6 hours as needed  for relief of pain. 2)  Stretch your ankles at night.   Current Allergies (reviewed today): ! MORPHINE

## 2010-04-02 NOTE — Letter (Signed)
Summary: Out of Work  [facility] at [facility]  [address] [place], [place] [address]   Phone: [phone]  Fax: [phone]    March 19, 2009   Employee:  DRAEDEN KELLMAN    To Whom It May Concern:   For Medical reasons, please excuse the above named employee from work for the following dates:  Start:  March 19, 2009 11:10 AM   End:  March 19, 2009 11:10 AM  May return on January 18th 2011. If you need additional information, please feel free to contact our office.         Sincerely,    [name] MD

## 2010-04-02 NOTE — Letter (Signed)
Summary: Out of Work  [facility] at [facility]  [address] [place], [place] [address]   Phone: [phone]  Fax: [phone]    June 07, 2009   Employee:  KHOEN GENET    To Whom It May Concern:   For Medical reasons, please excuse the above named employee from work for the following dates:  Start:  June 07, 2009 4:38 PM   End:   May return to work tomorrow April 8th 2011  If you need additional information, please feel free to contact our office.         Sincerely,    [name] MD

## 2010-04-02 NOTE — Progress Notes (Signed)
Summary: Wants WBC ordered  Phone Note Call from Patient Call back at [phone]   Caller: Patient Call For: DR. SHCALLER/ Dr. Milinda Antis  Summary of Call: Pt left v/m on triage phone and wants lab scheduled to check WBC. I called pt to get more detail and got pt v/m. Left message for patient to call back. Dr. Stoney Bang of [facility] says that the medication he is taking will lower wbc and it needs to be checked. Daniel Vang is what he is taking. Please advise.  Initial call taken by: Melody Comas,  March 09, 2009 4:15 PM  Follow-up for Phone Call        please sched cbc with diff 995.2  Follow-up by: Judith Part MD,  March 09, 2009 4:27 PM  Additional Follow-up for Phone Call Additional follow up Details #1::        [facility]  for pt to call back on monday to schedule lab appt. Additional Follow-up by: Lowella Petties CMA,  March 09, 2009 5:14 PM    Additional Follow-up for Phone Call Additional follow up Details #2::    [facility] for pt to call if he still wants labs done.                      Lowella Petties CMA  March 12, 2009 11:05 AM

## 2010-06-06 ENCOUNTER — Encounter: Payer: Self-pay | Admitting: Family Medicine

## 2010-06-10 ENCOUNTER — Ambulatory Visit (INDEPENDENT_AMBULATORY_CARE_PROVIDER_SITE_OTHER): Payer: PRIVATE HEALTH INSURANCE | Admitting: Family Medicine

## 2010-06-10 ENCOUNTER — Encounter: Payer: Self-pay | Admitting: Family Medicine

## 2010-06-10 VITALS — BP 90/60 | HR 76 | Temp 98.7°F | Ht 69.0 in | Wt 147.1 lb

## 2010-06-10 DIAGNOSIS — M6750 Plica syndrome, unspecified knee: Secondary | ICD-10-CM

## 2010-06-10 DIAGNOSIS — M6752 Plica syndrome, left knee: Secondary | ICD-10-CM

## 2010-06-10 NOTE — Patient Instructions (Signed)
Ice bid x 10 days  Motrin 800 mg po tid for 10 days

## 2010-06-10 NOTE — Progress Notes (Signed)
25 year old male:  Wed knee was bothering him a lot. Swollen a good bit, and thought that he had some fluid on his knee.   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  Patient presents with 7 day h/o l sided knee pain after no occult injury. Has started working out around 2 weeks ago. No audible pop was heard. The patient has not had a small effusion. No symptomatic giving-way. No mechanical clicking. Joint has not locked up. Patient has been able to walk but is limping. The patient does not have pain going up and down stairs or rising from a seated position.   Pain location: L Current physical activity: occ working out Prior Knee Surgery: none Bracing: none Occupation or school level: cook  The PMH, PSH, Social History, Family History, Medications, and allergies have been reviewed in [facility], and have been updated if relevant.  GEN: Well-developed,well-nourished,in no acute distress; alert,appropriate and cooperative throughout examination HEENT: Normocephalic and atraumatic without obvious abnormalities. Ears, externally no deformities PULM: Breathing comfortably in no respiratory distress EXT: No clubbing, cyanosis, or edema PSYCH: Normally interactive. Cooperative during the interview. Pleasant. Friendly and conversant. Not anxious or depressed appearing. Normal, full affect.  L knee Gait: Normal heel toe pattern ROM: WNL Effusion: mild effusion Echymosis or edema: none Patellar tendon NT Painful PLICA: large, medial L plica Patellar grind: negative Medial and lateral patellar facet loading: pos medial and lateral joint lines:NT Mcmurray's neg Flexion-pinch pos Varus and valgus stress: stable Lachman: neg Ant and Post drawer: neg Hip abduction, IR, ER: WNL Hip flexion str: 5/5 Hip abd: 5/5 Quad: 5/5 VMO atrophy:No Hamstring concentric and eccentric: 5/5  A/P: Knee Pain, plica syndrome: plical band, large medially, causing PF irritation.  Plica Syndrome  Painful plica band ID'd  on exam Massage for 8 weeks with hand multiple times a day and with an ice cube  If fails, would do a plica injection along band of tissue.

## 2010-06-18 ENCOUNTER — Telehealth: Payer: Self-pay | Admitting: *Deleted

## 2010-06-18 NOTE — Telephone Encounter (Signed)
Pt was seen last week for problems with his left knee- fluid.  He says this has gotten worse- the swelling was improving but now it is coming back. I suggested he make appt to drain fluid but he says he will not be able to come in until next Wednesday.  Any suggestions until then?

## 2010-06-18 NOTE — Telephone Encounter (Signed)
i would not expect it to improve this fast. i thought all from PLICA like we talked about  Minimally, would take 8 weeks or so to get better with the manual manipulation like we talked about.  If he gets frustrated, i could inject that band like we talked about, but it is in no way emergent.   Next week or later is fine, or he can wait or give it more time.

## 2010-06-21 NOTE — Telephone Encounter (Signed)
NO CAN NOT  GET IN TOUCH WITH PATIENT

## 2010-06-21 NOTE — Telephone Encounter (Signed)
Heather, has this been taken care of? 

## 2010-06-27 ENCOUNTER — Encounter: Payer: Self-pay | Admitting: Family Medicine

## 2010-06-27 ENCOUNTER — Ambulatory Visit (INDEPENDENT_AMBULATORY_CARE_PROVIDER_SITE_OTHER)
Admission: RE | Admit: 2010-06-27 | Discharge: 2010-06-27 | Disposition: A | Discharge status: Home / Self Care | Payer: PRIVATE HEALTH INSURANCE | Source: Ambulatory Visit | Attending: Family Medicine | Admitting: Family Medicine

## 2010-06-27 ENCOUNTER — Ambulatory Visit (INDEPENDENT_AMBULATORY_CARE_PROVIDER_SITE_OTHER): Payer: PRIVATE HEALTH INSURANCE | Admitting: Family Medicine

## 2010-06-27 DIAGNOSIS — M25562 Pain in left knee: Secondary | ICD-10-CM

## 2010-06-27 DIAGNOSIS — M2392 Unspecified internal derangement of left knee: Secondary | ICD-10-CM

## 2010-06-27 DIAGNOSIS — M25569 Pain in unspecified knee: Secondary | ICD-10-CM

## 2010-06-27 DIAGNOSIS — M2390 Unspecified internal derangement of unspecified knee: Secondary | ICD-10-CM

## 2010-06-27 NOTE — Progress Notes (Signed)
25 year old, f/u L knee pain:  Pain in his left knee, pain with walking.  Patient presents with 1 month h/o L sided knee pain after no known occult injury. No audible pop was heard. The patient has had a persistent effusion and worsening pain. No symptomatic giving-way. + mechanical clicking. Joint has not locked up. Patient has been able to walk but is limping. The patient does have pain going up and down stairs or rising from a seated position and while attempting to work.  Pain location: medial Current physical activity: limited by pain Prior Knee Surgery: none Current pain meds: NSAIDS Bracing: none Occupation or school level: [employer]  06/10/2010 OV: Wed knee was bothering him a lot. Swollen a good bit, and thought that he had some fluid on his knee.  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  Patient presents with 7 day h/o l sided knee pain after no occult injury. Has started working out around 2 weeks ago. No audible pop was heard. The patient has not had a small effusion. No symptomatic giving-way. No mechanical clicking. Joint has not locked up. Patient has been able to walk but is limping.  The patient does not have pain going up and down stairs or rising from a seated position.   Patient Active Problem List  Diagnoses  . BEHCETS SYNDROME  . PHLEBITIS&THROMBOPHLEB SUP VEINS UPPER EXTREM  . ALLERGIC RHINITIS  . WRIST PAIN, LEFT  . KNEE PAIN, BILATERAL  . THUMB PAIN, RIGHT  . SCOLIOSIS, MILD  . JAUNDICE  . OPEN WOUND FINGER WITHOUT MENTION COMPLICATION  . FOREIGN BODY, FINGER  . Left knee pain  . Internal derangement of left knee   Past Medical History  Diagnosis Date  . Allergy   . Behcet's syndrome    Past Surgical History  Procedure Date  . Tonsillectomy and adenoidectomy     1997   History  Substance Use Topics  . Smoking status: Former Smoker    Quit date: 06/09/2005  . Smokeless tobacco: Not on file  . Alcohol Use: Not on file   Family History    Problem Relation Age of Onset  . Diabetes Maternal Grandmother   . Diabetes Maternal Grandfather   . Diabetes Paternal Grandmother   . Heart attack Paternal Grandfather   . Hypertension Paternal Grandfather   . Diabetes Paternal Grandfather    Allergies  Allergen Reactions  . Morphine     REACTION: Nausea and vomiting and headache.   Current Outpatient Prescriptions on File Prior to Visit  Medication Sig Dispense Refill  . azaTHIOprine (IMURAN) 50 MG tablet Take 150 mg by mouth daily.        . fexofenadine (ALLEGRA) 180 MG tablet Take 180 mg by mouth daily as needed.        Marland Kitchen HYDROcodone-acetaminophen (VICODIN) 5-500 MG per tablet Take 1 tablet by mouth every 8 (eight) hours as needed.        Marland Kitchen ibuprofen (ADVIL,MOTRIN) 200 MG tablet Take 200 mg by mouth as needed.         REVIEW OF SYSTEMS  GEN: No fevers, chills. Nontoxic. Primarily MSK c/o today. MSK: Detailed in the HPI GI: tolerating PO intake without difficulty Neuro: No numbness, parasthesias, or tingling associated. Otherwise the pertinent positives of the ROS are noted above.   GEN: Well-developed,well-nourished,in no acute distress; alert,appropriate and cooperative throughout examination HEENT: Normocephalic and atraumatic without obvious abnormalities. Ears, externally no deformities PULM: Breathing comfortably in no respiratory distress EXT:  No clubbing, cyanosis, or edema PSYCH: Normally interactive. Cooperative during the interview. Pleasant. Friendly and conversant. Not anxious or depressed appearing. Normal, full affect.  LEFT KNEE Gait: Normal heel toe pattern ROM: 0-120 Effusion: MODERATE Echymosis or edema: none Patellar tendon NT Painful PLICA: LARGE MEDIAL PLICA Patellar grind: POSITIVE Medial and lateral patellar facet loading: TENDER EACH medial and lateral joint lines: MODERATE EACH Mcmurray's POS Flexion-pinch POS BOUNCE HOME POS Varus and valgus stress: stable Lachman: neg Ant and Post  drawer: neg Hip abduction, IR, ER: WNL Hip flexion str: 5/5 Hip abd: 5/5 Quad: 5/5 VMO atrophy:No Hamstring concentric and eccentric: 5/5  A/P: L knee pain, probable internal derangement:  Worsening despite 1 month of conservative therapy and clinically worsened by history and exam on recheck.  Plain films initially  If normal, recommend MRI of the left knee to evaluate for meniscal pathology, OCD, loose body, discoid meniscus. Concerning exam findings with peristent effusion in 25 yo with clinical worsening over 1 month of treatment with NSAIDS, activity mod, rest.

## 2010-06-27 NOTE — Patient Instructions (Signed)
REFERRAL: GO THE THE FRONT ROOM AT THE ENTRANCE OF OUR CLINIC, NEAR CHECK IN. ASK FOR MARION. SHE WILL HELP YOU SET UP YOUR REFERRAL. DATE: TIME:  

## 2010-06-28 ENCOUNTER — Telehealth: Payer: Self-pay | Admitting: *Deleted

## 2010-06-28 DIAGNOSIS — M25562 Pain in left knee: Secondary | ICD-10-CM

## 2010-06-28 DIAGNOSIS — M2392 Unspecified internal derangement of left knee: Secondary | ICD-10-CM
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
## 2010-06-29 NOTE — Telephone Encounter (Signed)
This remains the plan.

## 2010-07-01 ENCOUNTER — Ambulatory Visit (INDEPENDENT_AMBULATORY_CARE_PROVIDER_SITE_OTHER): Payer: PRIVATE HEALTH INSURANCE | Admitting: Family Medicine

## 2010-07-01 VITALS — BP 112/70 | HR 80 | Temp 98.6°F | Wt 149.8 lb

## 2010-07-01 DIAGNOSIS — J329 Chronic sinusitis, unspecified: Secondary | ICD-10-CM

## 2010-07-01 MED ORDER — FEXOFENADINE HCL 180 MG PO TABS: 180.0000 mg | ORAL_TABLET | Freq: Every day | ORAL | Status: DC | PRN

## 2010-07-01 MED ORDER — AMOXICILLIN 500 MG PO CAPS: 500.0000 mg | ORAL_CAPSULE | Freq: Two times a day (BID) | ORAL | Status: AC

## 2010-07-01 MED ORDER — FLUTICASONE PROPIONATE 50 MCG/ACT NA SUSP: 2.0000 | Freq: Every day | NASAL | Status: DC

## 2010-07-01 NOTE — Progress Notes (Signed)
  Subjective:     Daniel Vang is a 25 y.o. male who presents for evaluation of sinus pain. Symptoms include: congestion, cough, facial pain, fevers, nasal congestion, post nasal drip and sore throat. Onset of symptoms was 7 days ago. Symptoms have been gradually worsening since that time. Past history is significant for no history of pneumonia or bronchitis. Patient is a non-smoker.  The PMH, PSH, Social History, Family History, Medications, and allergies have been reviewed in [facility], and have been updated if relevant.  Review of Systems Pertinent items are noted in HPI.   Objective:    General appearance: alert, cooperative and appears stated age Head: Normocephalic, without obvious abnormality, atraumatic, sinuses tender to percussion Ears: normal TM's and external ear canals both ears Nose: Nares normal. Septum midline. Mucosa normal. No drainage or sinus tenderness., sinus tenderness bilateral Throat: lips, mucosa, and tongue normal; teeth and gums normal Neck: no adenopathy, no carotid bruit, no JVD, supple, symmetrical, trachea midline and thyroid not enlarged, symmetric, no tenderness/mass/nodules Lungs: clear to auscultation bilaterally Heart: regular rate and rhythm, S1, S2 normal, no murmur, click, rub or gallop Extremities: extremities normal, atraumatic, no cyanosis or edema    Assessment:    Acute bacterial sinusitis.    Plan:    Nasal saline sprays. Nasal steroids per medication orders. Amoxicillin per medication orders.

## 2010-07-01 NOTE — Telephone Encounter (Signed)
MRI set up at [facility] on 07/05/2010 at 7:30am. Patient is aware of this.

## 2010-07-05 ENCOUNTER — Ambulatory Visit: Payer: Self-pay | Admitting: Family Medicine

## 2010-07-15 ENCOUNTER — Encounter: Payer: Self-pay | Admitting: Family Medicine

## 2010-07-19 NOTE — Op Note (Signed)
NAME:  Daniel Vang, Daniel Vang NO.:  [id number]   MEDICAL RECORD NO.:  [id number]          PATIENT TYPE:  OBV   LOCATION:  0467                         FACILITY:  [facility]   PHYSICIAN:  Lyndal Pulley. Chales Salmon, M.D.   DATE OF BIRTH:  20-Mar-1985   DATE OF PROCEDURE:  02/20/2004  DATE OF DISCHARGE:  02/21/2004                                 OPERATIVE REPORT   Redictation.   PREOPERATIVE DIAGNOSES:  1.  Maxillary vertical skeletal hyperplasia.  2.  Mandibular sagittal skeletal deficiency.   POSTOPERATIVE DIAGNOSES:  1.  Maxillary vertical skeletal hyperplasia.  2.  Mandibular sagittal skeletal deficiency.   OPERATION PERFORMED:  1.  Maxillary Le Forte I osteotomy with rigid internal fixation.  2.  Mandibular bilateral sagittal split ramus osteotomies with rigid      internal fixation.   SURGEON:  Dutch Quint, M.D.   ANESTHESIA:  General via nasoendotracheal tube.   INDICATIONS:  The patient has been treated over the past several years by  Dr. Karena Addison, his orthodontist, who is unable to fully correct his  severe malocclusion with braces alone secondary to his underlying skeletal  facial deformity.   DESCRIPTION OF PROCEDURE:  The patient was identified in the holding area  and taken to the operating room where he was placed supine on the operating  room table.  Following intravenous induction of anesthesia, the patient was  intubated nasally without difficulty.  The nasoendotracheal tube was  secured, and the patient was turned 90 degrees from anesthesia where he was  prepped and draped in the usual fashion for orthognathic procedures.  Attention initially was directed intraorally where approximately 20 mL of 1%  lidocaine with 1:100,000 epinephrine was infiltrated along the entire  maxillary vestibule and along the posterior mandibular vestibule areas  bilaterally.  Approximately 10 minutes was allowed for hemostasis and  anesthesia.  Next, a throat pack was placed  and attention directed  intraorally where a #15 scalpel was used to make a posterior vestibular  incision in the right posterior mandibular area.  The incision was then  carried out down through the underlying muscle and onto the underlying  posterior border of the mandible.  A #9 periosteal elevator was used to  reflect a full-thickness mucoperiosteal flap, exposing the posterior border  of the mandible, ascending ramus, and medial ramus.  The entrance of the  lingula was identified entering the medial surface of the mandible and using  adequate retraction, a horizontal sagittal osteotomy was created superior to  the lingula.  This was carried onto the ascending ramus again in a sagittal  fashion anteriorly to approximately the second molar tooth.  The osteotomy  was then directed vertically through the inferior border of the mandible and  the outer cortex.  A moist pack was placed into the wound and attention  directed to the left posterior mandible where an identical procedure was  performed.  Attention was then directed to the maxillary vestibule where a  #15 scalpel again was used to make a circumvestibular incision.  The  incision was carried down through the underlying musculature, connective  tissue, onto the anterior maxilla.  Again a #9 periosteal elevator was used  to reflect a full-thickness mucoperiosteal flap, exposing the entire  anterior and lateral surface of the maxilla.  Care was also taken to reflect  the nasal mucosa from inside the bony nasal aperture.  Next, protecting the  nasal mucosa, a sagittal saw was used to make a horizontal Le Fort osteotomy  through the lateral and anterior maxillary walls into the bony nasal  aperture.  This was done bilaterally.  Next, a series of osteotomes was used  to complete the osteotomy through the maxilla and the maxilla mobilized  without difficulty.  The bony interferences were then carefully removed from  the maxilla and using  the premade surgical splint, the maxilla was brought  into its new position.  Once the maxilla was passively placed into its new  position, it was held rigidly secure with four 4-holed L-shaped 1.3 mm rigid  fixation plates.  The patient was then released from intermaxillary fixation  and found to occlude well into the premade splint.  Attention was then  directed back to the mandible where both of the sagittal osteotomies were  completed with a series of osteotomes.  The inferior alveolar nerve was  visualized within both operative sites and found to be intact.  The patient  again was brought into intermaxillary fixation utilizing the premade final  surgical splint.  Attention was directed to the right mandible where the  proximal segment was brought into its proper condylar relationship.  The two  segments were held secure with a modified Allis clamp, and three superior  border lag screws were then placed across the osteotomy sites using a 2.0 mm  Leibinger screw.  Once all three superior border screws were placed, the  modified Allis clamp was released, and the osteotomy checked for rigid  fixation and confirmed.  Attention was then directed to the left posterior  mandible where the identical procedure was performed.  The patient was then  released from intermaxillary fixation and found to occlude well into the  surgical splint.  All the operative site were then irrigated with copious  amounts of sterile saline.  The maxilla was then closed in a advancement V-Y  fashion using 3-0 plain gut suture.  The mandibular incisions were then  closed with 3-0 plain gut suture in a running baseball fashion.  The premade  surgical splint was then wired to the maxillary orthodontic appliances.  The  entire oral cavity was gently cleaned and suctioned free of clot and debris.  The throat pack was removed and the patient brought into intermaxillary fixation once again with heavy bolster elastics.  A  standard facial pressure  dressing was then placed using a 4-inch Ace wrap.  The patient was allowed  to awaken from the anesthesia, extubated in the operating room, and  transferred to the postanesthesia care unit in stable condition having  tolerated the procedure well.  Estimated blood loss was 50 mL.  Intraoperative medications included 2 g of Ancef and 16 mg of Decadron.      TGO/MEDQ  D:  04/29/2004  T:  04/29/2004  Job:  [id number]

## 2010-08-01 ENCOUNTER — Encounter: Payer: Self-pay | Admitting: Family Medicine

## 2010-08-01 ENCOUNTER — Ambulatory Visit (INDEPENDENT_AMBULATORY_CARE_PROVIDER_SITE_OTHER): Payer: PRIVATE HEALTH INSURANCE | Admitting: Family Medicine

## 2010-08-01 DIAGNOSIS — M25569 Pain in unspecified knee: Secondary | ICD-10-CM

## 2010-08-01 DIAGNOSIS — M25562 Pain in left knee: Secondary | ICD-10-CM

## 2010-08-01 NOTE — Progress Notes (Signed)
25 year old male, f/u significant knee pain, effusion and painful plica band:  Cont pain off and on with intermittent effusions off and on.   The PMH, PSH, Social History, Family History, Medications, and allergies have been reviewed in [facility], and have been updated if relevant.  REVIEW OF SYSTEMS  GEN: No fevers, chills. Nontoxic. Primarily MSK c/o today. MSK: Detailed in the HPI GI: tolerating PO intake without difficulty Neuro: No numbness, parasthesias, or tingling associated. Otherwise the pertinent positives of the ROS are noted above.    Physical Exam  Blood pressure 110/60, pulse 93, temperature 97.9 F (36.6 C), temperature source Oral, height 5\' 9"  (1.753 m), weight 144 lb 12.8 oz (65.681 kg), SpO2 98.00%.  GEN: Well-developed,well-nourished,in no acute distress; alert,appropriate and cooperative throughout examination HEENT: Normocephalic and atraumatic without obvious abnormalities. Ears, externally no deformities PULM: Breathing comfortably in no respiratory distress EXT: No clubbing, cyanosis, or edema PSYCH: Normally interactive. Cooperative during the interview. Pleasant. Friendly and conversant. Not anxious or depressed appearing. Normal, full affect.  Gait: Normal heel toe pattern ROM: WNL Effusion: neg Echymosis or edema: none Patellar tendon NT Painful PLICA: large medial band - painful Patellar grind: negative Medial and lateral patellar facet loading: mild TTP medial and lateral joint lines:NT Mcmurray's neg Flexion-pinch neg Varus and valgus stress: stable Lachman: neg Ant and Post drawer: neg Hip abduction, IR, ER: WNL Hip flexion str: 5/5 Hip abd: 5/5 Quad: 5/5 VMO atrophy: mild Hamstring concentric and eccentric: 5/5  A/P: Knee pain, cont.  Painful plica band  Failure of conservative treatment thus far, we discussed potential treatments and pt would like to inject band  Knee Injection, Left. Patient verbally consented to procedure. Risks,  benefits, and alternatives explained. Sterilely prepped with betadine. Ethyl cholride used for anesthesia. 2 cc Lidocaine 1% mixed with 1 cc of Kenalog 40 mg injected along medial plica band without difficulty. No complications with procedure and tolerated well. Patient had decreased pain post-injection.

## 2011-03-19 ENCOUNTER — Telehealth: Payer: Self-pay | Admitting: Internal Medicine

## 2011-03-19 ENCOUNTER — Ambulatory Visit (INDEPENDENT_AMBULATORY_CARE_PROVIDER_SITE_OTHER)
Admission: RE | Admit: 2011-03-19 | Discharge: 2011-03-19 | Disposition: A | Discharge status: Home / Self Care | Payer: 59 | Source: Ambulatory Visit | Attending: Family Medicine | Admitting: Family Medicine

## 2011-03-19 ENCOUNTER — Ambulatory Visit (INDEPENDENT_AMBULATORY_CARE_PROVIDER_SITE_OTHER): Payer: 59 | Admitting: Family Medicine

## 2011-03-19 ENCOUNTER — Encounter: Payer: Self-pay | Admitting: Family Medicine

## 2011-03-19 VITALS — BP 120/80 | HR 80 | Temp 98.2°F | Wt 153.2 lb

## 2011-03-19 DIAGNOSIS — M549 Dorsalgia, unspecified: Secondary | ICD-10-CM

## 2011-03-19 MED ORDER — MELOXICAM 15 MG PO TABS: 15.0000 mg | ORAL_TABLET | Freq: Every day | ORAL | Status: DC

## 2011-03-19 NOTE — Patient Instructions (Signed)
Please try wearing more supportive shoes as we discussed. Meloxicam daily for next 2 weeks. Try exercises on handout. Call me in 4-6 weeks if no improvement, sooner if worsening.

## 2011-03-19 NOTE — Progress Notes (Signed)
SUBJECTIVE:  Daniel Vang is a 26 y.o. male who complains of  low back pain 2 week(s) ago. The pain is positional with bending or lifting, with radiation down the legs. Mechanism of injury: unknown but works long hours on his feet on concrete floors. Symptoms have been constant since that time. Prior history of back problems: no prior back problems. There is numbness in the legs/thighs, right >left.  Patient Active Problem List  Diagnoses  . BEHCETS SYNDROME  . PHLEBITIS&THROMBOPHLEB SUP VEINS UPPER EXTREM  . ALLERGIC RHINITIS  . WRIST PAIN, LEFT  . KNEE PAIN, BILATERAL  . THUMB PAIN, RIGHT  . SCOLIOSIS, MILD  . JAUNDICE  . OPEN WOUND FINGER WITHOUT MENTION COMPLICATION  . FOREIGN BODY, FINGER  . Left knee pain   Past Medical History  Diagnosis Date  . Allergy   . Behcet's syndrome    Past Surgical History  Procedure Date  . Tonsillectomy and adenoidectomy     1997   History  Substance Use Topics  . Smoking status: Former Smoker    Quit date: 06/09/2005  . Smokeless tobacco: Not on file  . Alcohol Use: Not on file   Family History  Problem Relation Age of Onset  . Diabetes Maternal Grandmother   . Diabetes Maternal Grandfather   . Diabetes Paternal Grandmother   . Heart attack Paternal Grandfather   . Hypertension Paternal Grandfather   . Diabetes Paternal Grandfather    Allergies  Allergen Reactions  . Morphine     REACTION: Nausea and vomiting and headache.   Current Outpatient Prescriptions on File Prior to Visit  Medication Sig Dispense Refill  . fexofenadine (ALLEGRA) 180 MG tablet Take 1 tablet (180 mg total) by mouth daily as needed.  30 tablet  3  . ibuprofen (ADVIL,MOTRIN) 200 MG tablet Take 200 mg by mouth as needed.         The PMH, PSH, Social History, Family History, Medications, and allergies have been reviewed in [facility], and have been updated if relevant.  OBJECTIVE: BP 120/80  Pulse 80  Temp(Src) 98.2 F (36.8 C) (Oral)  Wt 153 lb 4 oz (69.514  kg)  Vital signs as noted above. Patient appears to be in mild to moderate pain, antalgic gait noted. Lumbosacral spine area reveals no local tenderness or mass. Painful and reduced LS ROM noted. Straight leg raise is positive  on right. DTR's, motor strength and sensation normal, including heel and toe gait.  Peripheral pulses are palpable. Lumbar spine X-Ray: ordered, but results not yet available.   ASSESSMENT:  lumbar strain and with radiculopathy  PLAN: See pt instructions For acute pain, rest, intermittent application of cold packs (later, may switch to heat, but do not sleep on heating pad), analgesics and muscle relaxants are recommended. Discussed longer term treatment plan of prn NSAID's and discussed a home back care exercise program with flexion exercise routine. Proper lifting with avoidance of heavy lifting discussed. Consider Physical Therapy and MRI if not improving. Call or return to clinic prn if these symptoms worsen or fail to improve as anticipated.

## 2011-03-19 NOTE — Telephone Encounter (Signed)
Message copied by Virgina Evener on Wed Mar 19, 2011  1:07 PM ------      Message from: Dianne Dun      Created: Wed Mar 19, 2011  9:25 AM       Please let pt know that his xray showed no acute findings which is good.

## 2011-03-19 NOTE — Telephone Encounter (Signed)
Informed patient of results

## 2011-06-17 ENCOUNTER — Ambulatory Visit (INDEPENDENT_AMBULATORY_CARE_PROVIDER_SITE_OTHER)
Admission: RE | Admit: 2011-06-17 | Discharge: 2011-06-17 | Disposition: A | Discharge status: Home / Self Care | Payer: 59 | Source: Ambulatory Visit | Attending: Family Medicine | Admitting: Family Medicine

## 2011-06-17 ENCOUNTER — Ambulatory Visit (INDEPENDENT_AMBULATORY_CARE_PROVIDER_SITE_OTHER): Payer: 59 | Admitting: Family Medicine

## 2011-06-17 ENCOUNTER — Encounter: Payer: Self-pay | Admitting: Family Medicine

## 2011-06-17 VITALS — BP 108/78 | HR 84 | Temp 99.3°F | Wt 158.0 lb

## 2011-06-17 DIAGNOSIS — S59919A Unspecified injury of unspecified forearm, initial encounter: Secondary | ICD-10-CM

## 2011-06-17 DIAGNOSIS — M25539 Pain in unspecified wrist: Secondary | ICD-10-CM

## 2011-06-17 DIAGNOSIS — S59909A Unspecified injury of unspecified elbow, initial encounter: Secondary | ICD-10-CM

## 2011-06-17 DIAGNOSIS — S6990XA Unspecified injury of unspecified wrist, hand and finger(s), initial encounter: Secondary | ICD-10-CM

## 2011-06-17 DIAGNOSIS — S6992XA Unspecified injury of left wrist, hand and finger(s), initial encounter: Secondary | ICD-10-CM

## 2011-06-17 NOTE — Progress Notes (Signed)
26 yo healthy male presents for left wrist pain after tripping over a step and landing on his left outstretched hand two days ago. He now has pain thumb flexion and extension and decreased range of motion. Denies any swelling, numbness or weekend of her hand wrist or thumb. Has been using NSAIDs for pain relief.   Patient Active Problem List  Diagnoses  . BEHCETS SYNDROME  . PHLEBITIS&THROMBOPHLEB SUP VEINS UPPER EXTREM  . ALLERGIC RHINITIS  . WRIST PAIN, LEFT  . KNEE PAIN, BILATERAL  . THUMB PAIN, RIGHT  . SCOLIOSIS, MILD  . JAUNDICE  . OPEN WOUND FINGER WITHOUT MENTION COMPLICATION  . FOREIGN BODY, FINGER  . Left knee pain   Past Medical History  Diagnosis Date  . Allergy   . Behcet's syndrome    Past Surgical History  Procedure Date  . Tonsillectomy and adenoidectomy     1997   History  Substance Use Topics  . Smoking status: Former Smoker    Quit date: 06/09/2005  . Smokeless tobacco: Not on file  . Alcohol Use: Not on file   Family History  Problem Relation Age of Onset  . Diabetes Maternal Grandmother   . Diabetes Maternal Grandfather   . Diabetes Paternal Grandmother   . Heart attack Paternal Grandfather   . Hypertension Paternal Grandfather   . Diabetes Paternal Grandfather    Allergies  Allergen Reactions  . Morphine     REACTION: Nausea and vomiting and headache.   Current Outpatient Prescriptions on File Prior to Visit  Medication Sig Dispense Refill  . ibuprofen (ADVIL,MOTRIN) 200 MG tablet Take 200 mg by mouth as needed.         The PMH, PSH, Social History, Family History, Medications, and allergies have been reviewed in [facility], and have been updated if relevant.   Review of Systems  See HPI  MS: Denies loss of strength.  Physical Exam  BP 108/78  Pulse 84  Temp(Src) 99.3 F (37.4 C) (Oral)  Wt 158 lb (71.668 kg)  General: Well-developed,well-nourished,in no acute distress; alert,appropriate and cooperative throughout examination  Msk: Left  wrist not swollen, no echymosis  Pos anterior snuff box tenderness  limited range of thumb flexion and extension  normal strength  normal pulses   Assessement and Plan: 1. Left wrist injury  DG Wrist Complete Left   New- concern for scaphoid fracture. Will place in thumb spica splint and order xray of left wrist- PA/Lateral and scaphoid view. See pt instructions for supportive care. Follow up with Dr. Patsy Lager in 5-7 days.

## 2011-06-17 NOTE — Patient Instructions (Signed)
Good to see you.  Please wear splint as directed. Make a follow up appointment with Dr. Patsy Lager in 5-7 days on your way out. We will call you with your xray results. You can take Ibuprofen - up to 800 mg three times daily for next several days.    To lessen the swelling and pain, keep the injured part elevated above your heart while sitting or lying down.   Apply ice to the injury for 15 to 20 minutes, 3 to 4 times per day while awake, for 2 days. Put the ice in a plastic bag and place a thin towel between the bag of ice and your cast.   Wear the splint as directed.   You may loosen the elastic bandage around the splint if your fingers become numb, tingle, or turn cold or blue.   If you have been put in a removable splint, wear and use as directed.   Do not put pressure on any part of your cast or splint; it may deform or break. Rest your cast or splint only on a pillow the first 24 hours until it is fully hardened.   Your cast or splint can be protected during bathing with a plastic bag. Do not lower the cast or splint into water.   Only take over-the-counter or prescription medicines for pain, discomfort, or fever as directed by your caregiver.   If your caregiver has given you a follow up appointment, it is very important to keep that appointment. Not keeping the appointment could result in chronic pain and decreased function. If there is any problem keeping the appointment, you must call back to this facility for assistance.  SEEK IMMEDIATE MEDICAL CARE IF:   Your cast gets damaged, wet or breaks.   You have continued severe pain or more swelling than you did before the cast or splint was put on.   Your skin or nails below the injury turn blue or gray, or feel cold or numb.   You have tingling or burning pain in your fingers or increasing pain with movement of your fingers  Document Released: 02/07/2002 Document Revised: 02/06/2011 Document Reviewed: 10/06/2008 [facility]  Patient Information 2012 [address] [place], [place].

## 2011-06-23 ENCOUNTER — Ambulatory Visit (INDEPENDENT_AMBULATORY_CARE_PROVIDER_SITE_OTHER)
Admission: RE | Admit: 2011-06-23 | Discharge: 2011-06-23 | Disposition: A | Discharge status: Home / Self Care | Payer: 59 | Source: Ambulatory Visit | Attending: Family Medicine | Admitting: Family Medicine

## 2011-06-23 ENCOUNTER — Encounter: Payer: Self-pay | Admitting: Family Medicine

## 2011-06-23 ENCOUNTER — Ambulatory Visit (INDEPENDENT_AMBULATORY_CARE_PROVIDER_SITE_OTHER): Payer: 59 | Admitting: Family Medicine

## 2011-06-23 VITALS — BP 98/60 | HR 69 | Temp 98.4°F | Ht 69.0 in | Wt 158.0 lb

## 2011-06-23 DIAGNOSIS — M25539 Pain in unspecified wrist: Secondary | ICD-10-CM

## 2011-06-23 DIAGNOSIS — S63599A Other specified sprain of unspecified wrist, initial encounter: Secondary | ICD-10-CM

## 2011-06-23 DIAGNOSIS — M25532 Pain in left wrist: Secondary | ICD-10-CM

## 2011-06-23 NOTE — Progress Notes (Signed)
  Patient Name: Daniel Vang Date of Birth: 13-Aug-1985 Age: 26 y.o. Medical Record Number: [id number] Gender: male Date of Encounter: 06/23/2011  History of Present Illness:  Daniel Vang is a 26 y.o. very pleasant male patient who presents with the following:  8 days ago, fell on outstretched hand. Swollen originally, now improved. Still with considerable pain with motion. Limited at work in what he can do. Pain with axial load, devation of the wrist in any way. No ecchymosis.  No prior wrist injury.  Past Medical History, Surgical History, Social History, Family History, Problem List, Medications, and Allergies have been reviewed and updated if relevant.  Review of Systems:  GEN: No fevers, chills. Nontoxic. Primarily MSK c/o today. MSK: Detailed in the HPI GI: tolerating PO intake without difficulty Neuro: No numbness, parasthesias, or tingling associated. Otherwise the pertinent positives of the ROS are noted above.    Physical Examination: Filed Vitals:   06/23/11 1154  BP: 98/60  Pulse: 69  Temp: 98.4 F (36.9 C)  TempSrc: Oral  Height: 5\' 9"  (1.753 m)  Weight: 158 lb (71.668 kg)  SpO2: 98%    Body mass index is 23.33 kg/(m^2).   GEN: WDWN, NAD, Non-toxic, Alert & Oriented x 3 HEENT: Atraumatic, Normocephalic.  Ears and Nose: No external deformity. EXTR: No clubbing/cyanosis/edema NEURO: Normal gait.  PSYCH: Normally interactive. Conversant. Not depressed or anxious appearing.  Calm demeanor.   Hand: LEFT Ecchymosis or edema: neg ROM wrist/hand/digits/elbow: pain with flexion, ext, ulnar and radial deviation. Carpals, MCP's, digits: NT Distal Ulna and Radius: DRUJ TTP and TTP with compression Supination lift test: POS Ecchymosis or edema: neg Cysts/nodules: neg Finkelstein's test: neg Snuffbox tenderness: neg Scaphoid tubercle: NT Hook of Hamate: NT Resisted supination: pain Full composite fist Grip, all digits: 5/5 str No tenosynovitis Axial load  test: neg Phalen's: neg Tinel's: neg Atrophy: neg  Hand sensation: intact   Assessment and Plan:  1. Wrist pain, left  DG Wrist Complete Left  2. DRUJ (distal radioulnar joint) sprain      At the least a DRUJ sprain + carpal ligament sprain. Immobilize now and recheck in 2 weeks.  Cannot exclude carpal ligament derangement. All explained to the patient in detail. Work restrictions.  Dg Wrist Complete Left  06/23/2011  *RADIOLOGY REPORT*  Clinical Data: Wrist pain.  LEFT WRIST - COMPLETE 3+ VIEW  Comparison: 06/17/2011 and 12/14/2009.  Findings: The mineralization and alignment are normal.  There is no evidence of acute fracture or dislocation.  Slight sclerosis of the proximal pole of the scaphoid is unchanged without fragmentation or joint space loss, likely normal variant.  IMPRESSION: Stable examination.  No acute osseous findings.  Original Report Authenticated By: Gerrianne Scale, M.D.   Dg Wrist Complete Left  06/17/2011  *RADIOLOGY REPORT*  Clinical Data: Left wrist pain post fall  LEFT WRIST - COMPLETE 3+ VIEW  Comparison: 12/14/2009  Findings: Bone mineralization normal. Joint spaces preserved. No fracture, dislocation, or bone destruction.  IMPRESSION: No acute osseous abnormalities. No significant change.  Original Report Authenticated By: Lollie Marrow, M.D.

## 2011-07-07 ENCOUNTER — Ambulatory Visit (INDEPENDENT_AMBULATORY_CARE_PROVIDER_SITE_OTHER): Payer: 59 | Admitting: Family Medicine

## 2011-07-07 ENCOUNTER — Encounter: Payer: Self-pay | Admitting: Family Medicine

## 2011-07-07 VITALS — BP 100/70 | HR 91 | Temp 98.3°F | Ht 69.0 in | Wt 159.4 lb

## 2011-07-07 DIAGNOSIS — M25539 Pain in unspecified wrist: Secondary | ICD-10-CM

## 2011-07-07 DIAGNOSIS — M25532 Pain in left wrist: Secondary | ICD-10-CM

## 2011-07-07 NOTE — Progress Notes (Signed)
  Patient Name: Daniel Vang Date of Birth: 14-Oct-1985 Age: 26 y.o. Medical Record Number: [id number] Gender: male Date of Encounter: 07/07/2011  History of Present Illness:  Daniel Vang is a 26 y.o. very pleasant male patient who presents with the following:  Followed left hand injury, the patient has been immobilized for the last 2 weeks. He is here on recheck, and is completely better. He has discontinued wearing the splint. His strength is full and he is not having any pain in his wrist.  Past Medical History, Surgical History, Social History, Family History, Problem List, Medications, and Allergies have been reviewed and updated if relevant.  Review of Systems:  GEN: No fevers, chills. Nontoxic. Primarily MSK c/o today. MSK: Detailed in the HPI GI: tolerating PO intake without difficulty Neuro: No numbness, parasthesias, or tingling associated. Otherwise the pertinent positives of the ROS are noted above.    Physical Examination: Filed Vitals:   07/07/11 0754  BP: 100/70  Pulse: 91  Temp: 98.3 F (36.8 C)  TempSrc: Oral  Height: 5\' 9"  (1.753 m)  Weight: 159 lb 6.4 oz (72.303 kg)  SpO2: 98%    Body mass index is 23.54 kg/(m^2).   GEN: WDWN, NAD, Non-toxic, Alert & Oriented x 3 HEENT: Atraumatic, Normocephalic.  Ears and Nose: No external deformity. EXTR: No clubbing/cyanosis/edema NEURO: Normal gait.  PSYCH: Normally interactive. Conversant. Not depressed or anxious appearing.  Calm demeanor.   Hand: L Ecchymosis or edema: neg ROM wrist/hand/digits/elbow: full  Carpals, MCP's, digits: NT Distal Ulna and Radius: NT Supination lift test: neg Ecchymosis or edema: neg Cysts/nodules: neg Finkelstein's test: neg Snuffbox tenderness: neg Scaphoid tubercle: NT Hook of Hamate: NT Resisted supination: NT Full composite fist Grip, all digits: 5/5 str No tenosynovitis Axial load test: neg Phalen's: neg Tinel's: neg Atrophy: neg  Hand sensation:  intact   Assessment and Plan:  Left wrist pain, resolved. Released to full duty. Followup when necessary

## 2012-01-28 ENCOUNTER — Ambulatory Visit: Payer: 59 | Admitting: Family Medicine

## 2012-02-02 ENCOUNTER — Ambulatory Visit: Payer: 59 | Admitting: Family Medicine

## 2012-02-02 DIAGNOSIS — Z0289 Encounter for other administrative examinations: Secondary | ICD-10-CM

## 2012-08-01 DIAGNOSIS — C801 Malignant (primary) neoplasm, unspecified: Secondary | ICD-10-CM | POA: Insufficient documentation

## 2012-08-01 HISTORY — DX: Malignant (primary) neoplasm, unspecified: C80.1

## 2012-08-16 ENCOUNTER — Encounter: Payer: Self-pay | Admitting: Family Medicine

## 2012-08-16 ENCOUNTER — Other Ambulatory Visit: Payer: Self-pay | Admitting: Urology

## 2012-08-16 ENCOUNTER — Ambulatory Visit (INDEPENDENT_AMBULATORY_CARE_PROVIDER_SITE_OTHER): Payer: 59 | Admitting: Family Medicine

## 2012-08-16 VITALS — BP 118/80 | HR 102 | Temp 99.5°F | Ht 69.0 in | Wt 163.0 lb

## 2012-08-16 DIAGNOSIS — N5089 Other specified disorders of the male genital organs: Secondary | ICD-10-CM

## 2012-08-16 DIAGNOSIS — N508 Other specified disorders of male genital organs: Secondary | ICD-10-CM

## 2012-08-16 DIAGNOSIS — N50811 Right testicular pain: Secondary | ICD-10-CM

## 2012-08-16 DIAGNOSIS — N509 Disorder of male genital organs, unspecified: Secondary | ICD-10-CM

## 2012-08-16 NOTE — Progress Notes (Signed)
Nature conservation officer at [facility] [address] [place] [address] Phone: [phone] Fax: [phone]  Date:  08/16/2012   Name:  Daniel Vang   DOB:  [birth year]   MRN:  [id number] Gender: male Age: 27 y.o.  Primary Physician:  Ruthe Mannan, MD  Evaluating MD: [name], MD   Chief Complaint: Groin Pain   History of Present Illness:  Daniel Vang is a 27 y.o. pleasant patient who presents with the following:  Markedly enlarged R testicle, enlarging over a month and quite tender. 1st ov about it.  No partner since October.  No pain or discharge.  Been going on for about a month.  No penile lesions or discharge.  No pain with ejaculation No hematuria No trauma  Patient Active Problem List   Diagnosis Date Noted  . Left knee pain 06/27/2010  . WRIST PAIN, LEFT 12/14/2009  . THUMB PAIN, RIGHT 03/19/2009  . FOREIGN BODY, FINGER 03/19/2009  . PHLEBITIS&THROMBOPHLEB SUP VEINS UPPER EXTREM 10/25/2008  . KNEE PAIN, BILATERAL 06/01/2008  . JAUNDICE 05/12/2008  . BEHCETS SYNDROME 08/31/2007  . OPEN WOUND FINGER WITHOUT MENTION COMPLICATION 07/15/2007  . ALLERGIC RHINITIS 04/20/2007  . SCOLIOSIS, MILD 04/20/2007    Past Medical History  Diagnosis Date  . Allergy   . Behcet's syndrome     Past Surgical History  Procedure Laterality Date  . Tonsillectomy and adenoidectomy      1997    History   Social History  . Marital Status: Single    Spouse Name: N/A    Number of Children: N/A  . Years of Education: N/A   Occupational History  . Not on file.   Social History Main Topics  . Smoking status: Former Smoker    Quit date: 06/09/2005  . Smokeless tobacco: Not on file  . Alcohol Use: Not on file  . Drug Use: Not on file  . Sexually Active: Not on file   Other Topics Concern  . Not on file   Social History Narrative  . No narrative on file    Family History  Problem Relation Age of Onset  . Diabetes Maternal Grandmother   . Diabetes  Maternal Grandfather   . Diabetes Paternal Grandmother   . Heart attack Paternal Grandfather   . Hypertension Paternal Grandfather   . Diabetes Paternal Grandfather     Allergies  Allergen Reactions  . Morphine     REACTION: Nausea and vomiting and headache.    Medication list has been reviewed and updated.  Outpatient Prescriptions Prior to Visit  Medication Sig Dispense Refill  . ibuprofen (ADVIL,MOTRIN) 200 MG tablet Take 200 mg by mouth as needed.         No facility-administered medications prior to visit.    Review of Systems:  ROS: GEN: Acute illness details above GI: Tolerating PO intake GU: maintaining adequate hydration and urination Pulm: No SOB Interactive and getting along well at home.  Otherwise, ROS is as per the HPI.   Physical Examination: BP 118/80  Pulse 102  Temp(Src) 99.5 F (37.5 C) (Oral)  Ht 5\' 9"  (1.753 m)  Wt 163 lb (73.936 kg)  BMI 24.06 kg/m2  SpO2 97%  Ideal Body Weight: Weight in (lb) to have BMI = 25: 168.9   GEN: WDWN, NAD, Non-toxic, Alert & Oriented x 3 HEENT: Atraumatic, Normocephalic.  Ears and Nose: No external deformity. EXTR: No clubbing/cyanosis/edema NEURO: Normal gait.  PSYCH: Normally interactive. Conversant. Not depressed or anxious appearing.  Calm  demeanor.  GU: normal penis. L testicle nontender throughout. R testicle is approx 300% in size compared to the left and notably tender to palpate diffusely. The testicle itself is palpable.   Assessment and Plan:  Pain in right testicle - Plan: Ambulatory referral to Urology  Enlarged testicle - Plan: Ambulatory referral to Urology  Very high level of concern regarding this exam today. I discussed the case with my partner Dr. Para March, who also had a high level of concern. Cannot rule out prior torsion, necrotic testicle, or other acute infection I am not familiar with.   We arranged to have urology see the patient today at 11 AM, and I am very appreciative of their  help with this case.   Orders Today:  Orders Placed This Encounter  Procedures  . Ambulatory referral to Urology    Referral Priority:  Urgent    Referral Type:  Consultation    Referral Reason:  Specialty Services Required    Requested Specialty:  Urology    Number of Visits Requested:  1    Updated Medication List: (Includes new medications, updates to list, dose adjustments) No orders of the defined types were placed in this encounter.    Medications Discontinued: There are no discontinued medications.    Signed, Elpidio Galea. Ardath Lepak, MD 08/16/2012 9:20 AM

## 2012-08-16 NOTE — Patient Instructions (Addendum)
REFERRAL: GO THE THE FRONT ROOM AT THE ENTRANCE OF OUR CLINIC, NEAR CHECK IN. ASK FOR MARION. SHE WILL HELP YOU SET UP YOUR REFERRAL. DATE: TIME:  

## 2012-08-19 ENCOUNTER — Encounter (HOSPITAL_BASED_OUTPATIENT_CLINIC_OR_DEPARTMENT_OTHER): Payer: Self-pay | Admitting: *Deleted

## 2012-08-19 NOTE — Progress Notes (Signed)
NPO AFTER MN. ARRIVES AT 0815. NEEDS HG.

## 2012-08-25 NOTE — H&P (Signed)
ctive Problems Problems  1. Testes Mass Right (8 cm) 608.89  History of Present Illness  Daniel Vang is a 27 yo WM sent in consultation by Dr. Dallas Schimke for right scrotal swelling and pain.   He had the onset a month ago of right scrotal swelling that gradually increased  and is now a bit bigger than a golf ball.  He has had some intermittantly severe pain over the past month.  He has the pain before but not the swelling.  He has had no voiding symptoms.   He has had no other GU history.   Past Medical History Problems  1. History of  Behcet's Syndrome 136.1  Surgical History Problems  1. History of  Adenoidectomy 2. History of  Appendectomy 3. History of  Jaw Surgery  Current Meds 1. Ibuprofen 800 MG Oral Tablet; Therapy: (Recorded:16Jun2014) to  Allergies Medication  1. Morphine Sulfate TABS  Family History Problems  1. Family history of  Hypertension V17.49  Social History Problems    Alcohol Use   Caffeine Use   Marital History - Single   Never A Smoker   Occupation: Denied    History of  Tobacco Use 305.1  Review of Systems Genitourinary, constitutional, skin, eye, otolaryngeal, hematologic/lymphatic, cardiovascular, pulmonary, endocrine, musculoskeletal, gastrointestinal, neurological and psychiatric system(s) were reviewed and pertinent findings if present are noted.  Constitutional: fever.  Integumentary: no new skin rashes or lesions and no pruritus.  Musculoskeletal: back pain (intermittant and worse with the testicle pain. ).    Vitals Vital Signs [Data Includes: Last 1 Day]  16Jun2014 11:47AM  BMI Calculated: 23.6 BSA Calculated: 1.91 Height: 5 ft 9.5 in Weight: 163 lb  Blood Pressure: 102 / 66 Temperature: 98.1 F Heart Rate: 85  Physical Exam Constitutional: Well nourished and well developed . No acute distress.  ENT:. The ears and nose are normal in appearance.  Neck: The appearance of the neck is normal and no neck mass is present.  Pulmonary:  No respiratory distress and normal respiratory rhythm and effort.  Cardiovascular: Heart rate and rhythm are normal . No peripheral edema.  Abdomen: The abdomen is soft and nontender (with slight RLQ pain. ). No masses are palpated. No CVA tenderness. No hernias are palpable. No hepatosplenomegaly noted.  Genitourinary: Examination of the penis demonstrates no discharge, no masses, no lesions and a normal meatus. The scrotum is normal in appearance, not erythematous and without lesions. Examination of the right scrotum demonstrates no hydrocele. The left epididymis is palpably normal and non-tender. The right spermatic cord is palpably normal. The left spermatic cord is palpably normal. The right testis is tender and found to have a 8 cm right testicular mass. The left testis is normal, non-tender and without masses.  Lymphatics: The supraclavicular, axillary, femoral and inguinal nodes are not enlarged or tender.  Skin: Normal skin turgor, no visible rash and no visible skin lesions.  Neuro/Psych:. Mood and affect are appropriate.    Results/Data Urine [Data Includes: Last 1 Day]   16Jun2014  COLOR AMBER   APPEARANCE CLEAR   SPECIFIC GRAVITY 1.015   pH 7.0   GLUCOSE NEG mg/dL  BILIRUBIN NEG   KETONE NEG mg/dL  BLOOD NEG   PROTEIN NEG mg/dL  UROBILINOGEN 1 mg/dL  NITRITE NEG   LEUKOCYTE ESTERASE NEG    Old records or history reviewed: I have reviewed the office note from Dr. Dallas Schimke from today.  The following images/tracing/specimen were independently visualized:  Scrotal US today shows an 8cm mass  involving the entire right testicle. It is inhomogeneous with blood flow on doppler. The right epididymis is not well seen. The left testicle is normal in size and appearance as is the left epididymis. There is a small left varicocele. See study sheet for details.    Assessment Assessed  1. 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   Plan Health Maintenance (V70.0)  1. UA With REFLEX  Done: 16Jun2014 10:28AM Testes Mass Right (8 cm) (608.89)  2. ALPHA-FETOPROTIEN (TUMOR MARKER)  Requested for: 16Jun2014 3. BETA HCG TUMOR MARKER  Requested for: 16Jun2014 4. CBC W/DIFF  Requested for: 16Jun2014 5. COMPREHENSIVE METABOLIC PANEL  Requested for: 16Jun2014 6. SCROTAL DOPPLER  Requested for: 16Jun2014 7. SCROTAL U/S  Done: 16Jun2014 12:00AM   I believe a right inguinal orchiectomy is the most appropriate option since the testicle has no residual normal architechture and tumor is most likely. I reviewed the risks of bleeding, infection, wound complications, hernias, nerve injury, thrombotic events and anesthetic complications.  We discussed a testicular prosthesis and he would like to consider that. I will get blood work including tumor markers today. I discussed the need for additional imaging and therapy depending on the final pathology.   Discussion/Summary  CC: Dr. Kerin Perna.

## 2012-08-26 ENCOUNTER — Encounter (HOSPITAL_BASED_OUTPATIENT_CLINIC_OR_DEPARTMENT_OTHER): Payer: Self-pay | Admitting: *Deleted

## 2012-08-26 ENCOUNTER — Ambulatory Visit (HOSPITAL_BASED_OUTPATIENT_CLINIC_OR_DEPARTMENT_OTHER): Payer: 59 | Admitting: Anesthesiology

## 2012-08-26 ENCOUNTER — Encounter (HOSPITAL_BASED_OUTPATIENT_CLINIC_OR_DEPARTMENT_OTHER)
Admission: RE | Disposition: A | Discharge status: Home / Self Care | Payer: Self-pay | Source: Ambulatory Visit | Attending: Urology

## 2012-08-26 ENCOUNTER — Ambulatory Visit (HOSPITAL_BASED_OUTPATIENT_CLINIC_OR_DEPARTMENT_OTHER)
Admission: RE | Admit: 2012-08-26 | Discharge: 2012-08-26 | Disposition: A | Discharge status: Home / Self Care | Payer: 59 | Source: Ambulatory Visit | Attending: Urology | Admitting: Urology

## 2012-08-26 ENCOUNTER — Encounter (HOSPITAL_BASED_OUTPATIENT_CLINIC_OR_DEPARTMENT_OTHER): Payer: Self-pay | Admitting: Anesthesiology

## 2012-08-26 DIAGNOSIS — M352 Behcet's disease: Secondary | ICD-10-CM | POA: Insufficient documentation

## 2012-08-26 DIAGNOSIS — C6290 Malignant neoplasm of unspecified testis, unspecified whether descended or undescended: Secondary | ICD-10-CM | POA: Insufficient documentation

## 2012-08-26 DIAGNOSIS — C6211 Malignant neoplasm of descended right testis: Secondary | ICD-10-CM

## 2012-08-26 DIAGNOSIS — I739 Peripheral vascular disease, unspecified: Secondary | ICD-10-CM | POA: Insufficient documentation

## 2012-08-26 HISTORY — DX: Personal history of thrombophlebitis: Z86.72

## 2012-08-26 HISTORY — PX: ORCHIECTOMY: SHX2116

## 2012-08-26 HISTORY — DX: Malignant neoplasm of descended right testis: C62.11

## 2012-08-26 SURGERY — ORCHIECTOMY
Anesthesia: General | Site: Groin | Laterality: Right | Wound class: Clean

## 2012-08-26 MED ORDER — BUPIVACAINE HCL (PF) 0.25 % IJ SOLN
INTRAMUSCULAR | Status: DC | PRN
  Administered 2012-08-26: 4 mL

## 2012-08-26 MED ORDER — ONDANSETRON HCL 4 MG/2ML IJ SOLN
4.0000 mg | Freq: Four times a day (QID) | INTRAMUSCULAR | Status: DC | PRN
  Filled 2012-08-26: qty 2

## 2012-08-26 MED ORDER — LIDOCAINE HCL (CARDIAC) 20 MG/ML IV SOLN
INTRAVENOUS | Status: DC | PRN
  Administered 2012-08-26: 80 mg via INTRAVENOUS

## 2012-08-26 MED ORDER — MIDAZOLAM HCL 5 MG/5ML IJ SOLN
INTRAMUSCULAR | Status: DC | PRN
  Administered 2012-08-26: 2 mg via INTRAVENOUS

## 2012-08-26 MED ORDER — OXYCODONE HCL 5 MG PO TABS
5.0000 mg | ORAL_TABLET | ORAL | Status: DC | PRN
  Administered 2012-08-26: 5 mg via ORAL
  Filled 2012-08-26: qty 2

## 2012-08-26 MED ORDER — SODIUM CHLORIDE 0.9 % IJ SOLN
3.0000 mL | Freq: Two times a day (BID) | INTRAMUSCULAR | Status: DC
  Filled 2012-08-26: qty 3

## 2012-08-26 MED ORDER — ONDANSETRON HCL 4 MG/2ML IJ SOLN
INTRAMUSCULAR | Status: DC | PRN
  Administered 2012-08-26: 4 mg via INTRAVENOUS

## 2012-08-26 MED ORDER — SODIUM CHLORIDE 0.9 % IV SOLN
250.0000 mL | INTRAVENOUS | Status: DC | PRN
  Filled 2012-08-26: qty 250

## 2012-08-26 MED ORDER — ACETAMINOPHEN 650 MG RE SUPP
650.0000 mg | RECTAL | Status: DC | PRN
  Filled 2012-08-26: qty 1

## 2012-08-26 MED ORDER — HYDROCODONE-ACETAMINOPHEN 5-325 MG PO TABS
1.0000 | ORAL_TABLET | Freq: Four times a day (QID) | ORAL | Status: DC | PRN
Start: 2012-08-26 — End: 2012-09-08

## 2012-08-26 MED ORDER — FENTANYL CITRATE 0.05 MG/ML IJ SOLN
25.0000 ug | INTRAMUSCULAR | Status: DC | PRN
  Filled 2012-08-26: qty 1

## 2012-08-26 MED ORDER — PROPOFOL 10 MG/ML IV BOLUS
INTRAVENOUS | Status: DC | PRN
  Administered 2012-08-26: 200 mg via INTRAVENOUS

## 2012-08-26 MED ORDER — SODIUM CHLORIDE 0.9 % IJ SOLN
3.0000 mL | INTRAMUSCULAR | Status: DC | PRN
  Filled 2012-08-26: qty 3

## 2012-08-26 MED ORDER — PROMETHAZINE HCL 25 MG/ML IJ SOLN
6.2500 mg | INTRAMUSCULAR | Status: DC | PRN
  Administered 2012-08-26: 6.25 mg via INTRAVENOUS
  Filled 2012-08-26: qty 1

## 2012-08-26 MED ORDER — LACTATED RINGERS IV SOLN
INTRAVENOUS | Status: DC
  Administered 2012-08-26: 12:00:00 via INTRAVENOUS
  Filled 2012-08-26: qty 1000

## 2012-08-26 MED ORDER — KETOROLAC TROMETHAMINE 30 MG/ML IJ SOLN
INTRAMUSCULAR | Status: DC | PRN
  Administered 2012-08-26: 30 mg via INTRAVENOUS

## 2012-08-26 MED ORDER — ACETAMINOPHEN 325 MG PO TABS
650.0000 mg | ORAL_TABLET | ORAL | Status: DC | PRN
  Filled 2012-08-26: qty 2

## 2012-08-26 MED ORDER — DEXAMETHASONE SODIUM PHOSPHATE 4 MG/ML IJ SOLN
INTRAMUSCULAR | Status: DC | PRN
  Administered 2012-08-26: 10 mg via INTRAVENOUS

## 2012-08-26 MED ORDER — LACTATED RINGERS IV SOLN
INTRAVENOUS | Status: DC
  Administered 2012-08-26 (×3): via INTRAVENOUS
  Filled 2012-08-26: qty 1000

## 2012-08-26 MED ORDER — CEFAZOLIN SODIUM-DEXTROSE 2-3 GM-% IV SOLR
INTRAVENOUS | Status: DC | PRN
  Administered 2012-08-26: 2 g via INTRAVENOUS

## 2012-08-26 MED ORDER — FENTANYL CITRATE 0.05 MG/ML IJ SOLN
INTRAMUSCULAR | Status: DC | PRN
  Administered 2012-08-26: 100 ug via INTRAVENOUS

## 2012-08-26 MED ORDER — SODIUM CHLORIDE 0.9 % IR SOLN
Status: DC | PRN
  Administered 2012-08-26: 10:00:00

## 2012-08-26 SURGICAL SUPPLY — 62 items
ADH SKN CLS APL DERMABOND .7 (GAUZE/BANDAGES/DRESSINGS) ×1
APL SKNCLS STERI-STRIP NONHPOA (GAUZE/BANDAGES/DRESSINGS) ×1
APPLICATOR COTTON TIP 6IN STRL (MISCELLANEOUS) IMPLANT
BANDAGE GAUZE ELAST BULKY 4 IN (GAUZE/BANDAGES/DRESSINGS) ×2 IMPLANT
BENZOIN TINCTURE PRP APPL 2/3 (GAUZE/BANDAGES/DRESSINGS) ×2 IMPLANT
BLADE SURG 15 STRL LF DISP TIS (BLADE) ×1 IMPLANT
BLADE SURG 15 STRL SS (BLADE) ×2
BLADE SURG ROTATE 9660 (MISCELLANEOUS) ×2 IMPLANT
CANISTER SUCTION 1200CC (MISCELLANEOUS) ×2 IMPLANT
CLEANER CAUTERY TIP 5X5 PAD (MISCELLANEOUS) ×1 IMPLANT
CLOTH BEACON ORANGE TIMEOUT ST (SAFETY) ×2 IMPLANT
COVER MAYO STAND STRL (DRAPES) ×2 IMPLANT
COVER TABLE BACK 60X90 (DRAPES) ×2 IMPLANT
DERMABOND ADVANCED (GAUZE/BANDAGES/DRESSINGS) ×1
DERMABOND ADVANCED .7 DNX12 (GAUZE/BANDAGES/DRESSINGS) IMPLANT
DISSECTOR ROUND CHERRY 3/8 STR (MISCELLANEOUS) ×1 IMPLANT
DRAIN PENROSE 18X1/4 LTX STRL (WOUND CARE) ×1 IMPLANT
DRAPE LAPAROTOMY TRNSV 102X78 (DRAPE) ×2 IMPLANT
DRSG TEGADERM 4X4.75 (GAUZE/BANDAGES/DRESSINGS) ×2 IMPLANT
ELECT NDL TIP 2.8 STRL (NEEDLE) IMPLANT
ELECT NEEDLE TIP 2.8 STRL (NEEDLE) ×2 IMPLANT
ELECT REM PT RETURN 9FT ADLT (ELECTROSURGICAL) ×2
ELECTRODE REM PT RTRN 9FT ADLT (ELECTROSURGICAL) ×1 IMPLANT
GAUZE SPONGE 4X4 12PLY STRL LF (GAUZE/BANDAGES/DRESSINGS) ×3 IMPLANT
GLOVE BIO SURGEON STRL SZ7 (GLOVE) ×1 IMPLANT
GLOVE ECLIPSE 6.5 STRL STRAW (GLOVE) ×1 IMPLANT
GLOVE INDICATOR 7.0 STRL GRN (GLOVE) ×2 IMPLANT
GLOVE SKINSENSE NS SZ6.5 (GLOVE) ×1
GLOVE SKINSENSE STRL SZ6.5 (GLOVE) IMPLANT
GLOVE SURG SS PI 8.0 STRL IVOR (GLOVE) ×2 IMPLANT
GOWN PREVENTION PLUS LG XLONG (DISPOSABLE) ×3 IMPLANT
GOWN STRL REIN XL XLG (GOWN DISPOSABLE) ×2 IMPLANT
NEEDLE HYPO 22GX1.5 SAFETY (NEEDLE) ×2 IMPLANT
NS IRRIG 500ML POUR BTL (IV SOLUTION) IMPLANT
PACK BASIN DAY SURGERY FS (CUSTOM PROCEDURE TRAY) ×2 IMPLANT
PAD CLEANER CAUTERY TIP 5X5 (MISCELLANEOUS) ×1
PENCIL BUTTON HOLSTER BLD 10FT (ELECTRODE) ×2 IMPLANT
PROSTHESIS TESTICULAR NAC LRG (Urological Implant) IMPLANT
STRIP CLOSURE SKIN 1/2X4 (GAUZE/BANDAGES/DRESSINGS) ×2 IMPLANT
SUPPORT SCROTAL LG STRP (MISCELLANEOUS) ×1 IMPLANT
SUT CHROMIC 3 0 SH 27 (SUTURE) ×2 IMPLANT
SUT CHROMIC GUT AB #0 18 (SUTURE) IMPLANT
SUT PROLENE 2 0 CT2 30 (SUTURE) IMPLANT
SUT SILK 3 0 TIES 17X18 (SUTURE)
SUT SILK 3-0 18XBRD TIE BLK (SUTURE) IMPLANT
SUT VIC AB 3-0 SH 27 (SUTURE) ×2
SUT VIC AB 3-0 SH 27X BRD (SUTURE) ×1 IMPLANT
SUT VIC AB 4-0 P-3 18XBRD (SUTURE) IMPLANT
SUT VIC AB 4-0 P3 18 (SUTURE)
SUT VICRYL 0 TIES 12 18 (SUTURE) ×2 IMPLANT
SUT VICRYL 2 0 18  UND BR (SUTURE)
SUT VICRYL 2 0 18 UND BR (SUTURE) IMPLANT
SUT VICRYL 4-0 PS2 18IN ABS (SUTURE) ×2 IMPLANT
SYR BULB IRRIGATION 50ML (SYRINGE) ×2 IMPLANT
SYR CONTROL 10ML LL (SYRINGE) ×2 IMPLANT
TESTICULAR PROSTHESIS NAC LRG (Urological Implant) ×2 IMPLANT
TOWEL OR 17X24 6PK STRL BLUE (TOWEL DISPOSABLE) ×4 IMPLANT
TRAY DSU PREP LF (CUSTOM PROCEDURE TRAY) ×2 IMPLANT
TUBE CONNECTING 12X1/4 (SUCTIONS) ×2 IMPLANT
VACUTAINER 21G X 3/4" X 12" ×1 IMPLANT
WATER STERILE IRR 500ML POUR (IV SOLUTION) IMPLANT
YANKAUER SUCT BULB TIP NO VENT (SUCTIONS) ×2 IMPLANT

## 2012-08-26 NOTE — Anesthesia Procedure Notes (Signed)
Procedure Name: LMA Insertion Date/Time: 08/26/2012 9:27 AM Performed by: Gar Gibbon Pre-anesthesia Checklist: Patient identified, Emergency Drugs available, Suction available and Patient being monitored Patient Re-evaluated:Patient Re-evaluated prior to inductionOxygen Delivery Method: Circle System Utilized Preoxygenation: Pre-oxygenation with 100% oxygen Intubation Type: IV induction Ventilation: Mask ventilation without difficulty LMA: LMA inserted LMA Size: 3.0 Number of attempts: 1 Airway Equipment and Method: bite block Placement Confirmation: positive ETCO2 Tube secured with: Tape Dental Injury: Teeth and Oropharynx as per pre-operative assessment

## 2012-08-26 NOTE — Transfer of Care (Signed)
Immediate Anesthesia Transfer of Care Note  Patient: Daniel Vang  Procedure(s) Performed: Procedure(s): RIGHT INGUINAL ORCHIECTOMY WITH TESTICULAR PROTHESIS (Right)  Patient Location: PACU  Anesthesia Type:General  Level of Consciousness: sedated  Airway & Oxygen Therapy: Patient Spontanous Breathing and Patient connected to face mask oxygen  Post-op Assessment: Report given to PACU RN and Post -op Vital signs reviewed and stable  Post vital signs: Reviewed and stable  Complications: No apparent anesthesia complications

## 2012-08-26 NOTE — Brief Op Note (Signed)
08/26/2012  10:50 AM  PATIENT:  Daniel Vang  27 y.o. male  PRE-OPERATIVE DIAGNOSIS:  right scrotal mass  POST-OPERATIVE DIAGNOSIS:  right scrotal mass  PROCEDURE:  Procedure(s): RIGHT INGUINAL ORCHIECTOMY WITH TESTICULAR PROTHESIS (Right)2  SURGEON:  Surgeon(s) and Role:    * Anner Crete, MD - Primary  PHYSICIAN ASSISTANT:   ASSISTANTS: none   ANESTHESIA:   general  EBL:  Total I/O In: 1100 [I.V.:1100] Out: -   BLOOD ADMINISTERED:none  DRAINS: none   LOCAL MEDICATIONS USED:  MARCAINE     SPECIMEN:  Source of Specimen:  right testicle and cord  DISPOSITION OF SPECIMEN:  PATHOLOGY  COUNTS:  YES  TOURNIQUET:  * No tourniquets in log *  DICTATION: .Other Dictation: Dictation Number [id number]  PLAN OF CARE: Discharge to home after PACU  PATIENT DISPOSITION:  PACU - hemodynamically stable.   Delay start of Pharmacological VTE agent (>24hrs) due to surgical blood loss or risk of bleeding: not applicable

## 2012-08-26 NOTE — Interval H&P Note (Signed)
History and Physical Interval Note:  08/26/2012 9:12 AM  Daniel Vang  has presented today for surgery, with the diagnosis of right scrotal mass  The various methods of treatment have been discussed with the patient and family. After consideration of risks, benefits and other options for treatment, the patient has consented to  Procedure(s): RIGHT INGUINAL ORCHIECTOMY WITH TESTICULAR PROTHESIS (Right) as a surgical intervention .  The patient's history has been reviewed, patient examined, no change in status, stable for surgery.  I have reviewed the patient's chart and labs.  Questions were answered to the patient's satisfaction.     [name] J

## 2012-08-26 NOTE — Anesthesia Preprocedure Evaluation (Addendum)
Anesthesia Evaluation  Patient identified by MRN, date of birth, ID band Patient awake    Reviewed: Allergy & Precautions, H&P , NPO status , Patient's Chart, lab work & pertinent test results  Airway Mallampati: II TM Distance: >3 FB Neck ROM: Full  Mouth opening: Limited Mouth Opening  Dental  (+) Teeth Intact and Dental Advisory Given   Pulmonary neg pulmonary ROS,  breath sounds clear to auscultation  Pulmonary exam normal       Cardiovascular + Peripheral Vascular Disease (Vasculitis, hx of thrombophlebitis) Rhythm:Regular     Neuro/Psych negative neurological ROS  negative psych ROS   GI/Hepatic negative GI ROS, Neg liver ROS,   Endo/Other  negative endocrine ROS  Renal/GU negative Renal ROS  negative genitourinary   Musculoskeletal negative musculoskeletal ROS (+)   Abdominal   Peds  Hematology negative hematology ROS (+)   Anesthesia Other Findings Behcets syndrome; diminished oral radius with apthous ulcers oropharynx and buccal mucosa.  Reproductive/Obstetrics                         Anesthesia Physical Anesthesia Plan  ASA: II  Anesthesia Plan: General   Post-op Pain Management:    Induction: Intravenous  Airway Management Planned: LMA  Additional Equipment:   Intra-op Plan:   Post-operative Plan: Extubation in OR  Informed Consent: I have reviewed the patients History and Physical, chart, labs and discussed the procedure including the risks, benefits and alternatives for the proposed anesthesia with the patient or authorized representative who has indicated his/her understanding and acceptance.   Dental advisory given  Plan Discussed with: CRNA  Anesthesia Plan Comments:         Anesthesia Quick Evaluation

## 2012-08-27 ENCOUNTER — Encounter (HOSPITAL_BASED_OUTPATIENT_CLINIC_OR_DEPARTMENT_OTHER): Payer: Self-pay | Admitting: Urology

## 2012-08-27 LAB — POCT HEMOGLOBIN-HEMACUE: Hemoglobin: 15.5 g/dL (ref 13.0–17.0)

## 2012-08-27 NOTE — Anesthesia Postprocedure Evaluation (Signed)
Anesthesia Post Note  Patient: Daniel Vang  Procedure(s) Performed: Procedure(s) (LRB): RIGHT INGUINAL ORCHIECTOMY WITH TESTICULAR PROTHESIS (Right)  Anesthesia type: General  Patient location: PACU  Post pain: Pain level controlled  Post assessment: Post-op Vital signs reviewed  Last Vitals:  Filed Vitals:   08/26/12 1250  BP: 112/75  Pulse: 58  Temp: 36.3 C  Resp: 14    Post vital signs: Reviewed  Level of consciousness: sedated  Complications: No apparent anesthesia complications

## 2012-08-27 NOTE — Op Note (Signed)
NAMEDAEQUAN, KOZMA NO.:  [id number]  MEDICAL RECORD NO.:  [id number]  LOCATION:                                 FACILITY:  PHYSICIAN:  Excell Seltzer. Annabell Howells, M.D.    DATE OF BIRTH:  October 05, 1985  DATE OF PROCEDURE:  08/26/2012 DATE OF DISCHARGE:                              OPERATIVE REPORT   PROCEDURE:  Right radical orchiectomy with placement of a testicular prosthesis.  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  POSTOPERATIVE DIAGNOSIS:  Right testicular mass, probable testicular cancer.  SURGEON:  Excell Seltzer. Annabell Howells, M.D.  ANESTHESIA:  General.  SPECIMENS:  Right testicle and spermatic cord.  BLOOD LOSS:  Minimal.  COMPLICATIONS:  None.  INDICATIONS:  Daniel Vang is a 27 year old white male who presented with a 1- month history of right testicular mass.  Ultrasound revealed a solid lesion with blood flow.  Preop tumor markers revealed mild elevation of a beta HCG to 14, but a normal alpha fetoprotein and LDH.  Radical orchidectomy was felt to be indicated for treatment and he elected to have a testicular prosthesis placed.  FINDINGS AND PROCEDURE:  He was given 2 g of Ancef.  Taken to the operating room where general anesthetic was induced.  He was placed in a supine position and fitted with PAS hose.  His left inguinal area was clipped.  He was prepped with Betadine solution and draped in the usual sterile fashion.  An oblique incision was made superior lateral to the pubic tubercle along the skin lines for approximately 5 cm.  The deep dermis and subcutaneous tissue was divided with the Bovie with a single vein requiring cauterization prior to division.  Once the fascia was encountered, the external oblique fascia was opened along the direction of the fibers over the cord.  The ilioinguinal nerve was identified and protected.  The cord was then delivered from the wound and doubly encircled with a quarter-inch Penrose  drain, which was used to provide vascular occlusion.  The testicle was then delivered from the scrotum into the wound and then the gubernacular attachments were taken down with blunt dissection and cautery with great care to ensure good hemostasis in the scrotum.  Once the testicle was delivered from the wound, the cord was divided into few packets at its entrance to the internal ring and the testicle was then removed.  The cord was then ligated using 0-Vicryl ties.  Each packet was doubly ligated and the ends of the sutures were left long for future identification if need be.  At this point, the wound was irrigated copiously with double antibiotic solution, and the testicular prosthesis was placed.  A large prosthesis was chosen, this was filled with 20 mL of sterile saline after irrigation of the external surface with antibiotic solution.  The prosthesis was then secured to the dependent portion of scrotum using a 2-0 Prolene stitch.  Once the prosthesis had been seated in the scrotum, the area was irrigated once again with double antibiotic solution.  At this point, the external oblique fascia was reapproximated using 3-0 Vicryl.  The fascia was infiltrated with about 4 mL of 0.25% Marcaine. The  remainder of the 10 mL was injected in the subcutaneous space.  Once hemostasis was assured, the subcutaneous tissue was reapproximated using an interrupted 3-0 chromic.  The skin was then closed using a running intracuticular 4-0 Vicryl.  The wound was then reinforced with Dermabond.  At this point, a scrotal support was applied.  The patient's anesthetic was reversed.  He was moved to the recovery room in a stable condition. There were no complications.     Excell Seltzer. Annabell Howells, M.D.     JJW/MEDQ  D:  08/26/2012  T:  08/27/2012  Job:  [id number]

## 2012-08-31 ENCOUNTER — Other Ambulatory Visit: Payer: Self-pay | Admitting: Urology

## 2012-08-31 ENCOUNTER — Ambulatory Visit (HOSPITAL_COMMUNITY)
Admission: RE | Admit: 2012-08-31 | Discharge: 2012-08-31 | Disposition: A | Discharge status: Home / Self Care | Payer: 59 | Source: Ambulatory Visit | Attending: Urology | Admitting: Urology

## 2012-08-31 DIAGNOSIS — C6290 Malignant neoplasm of unspecified testis, unspecified whether descended or undescended: Secondary | ICD-10-CM

## 2012-09-07 ENCOUNTER — Telehealth: Payer: Self-pay | Admitting: Oncology

## 2012-09-07 ENCOUNTER — Other Ambulatory Visit: Payer: Self-pay | Admitting: Oncology

## 2012-09-07 DIAGNOSIS — C6290 Malignant neoplasm of unspecified testis, unspecified whether descended or undescended: Secondary | ICD-10-CM

## 2012-09-07 NOTE — Telephone Encounter (Signed)
S/W PT IN RE NP APPT 07/09 @ 1:30 W/DR. SHADAD REFERRING DR. 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

## 2012-09-07 NOTE — Telephone Encounter (Signed)
C/D 09/07/12 for appt. 09/08/12

## 2012-09-08 ENCOUNTER — Telehealth: Payer: Self-pay | Admitting: Oncology

## 2012-09-08 ENCOUNTER — Ambulatory Visit (HOSPITAL_BASED_OUTPATIENT_CLINIC_OR_DEPARTMENT_OTHER): Payer: 59

## 2012-09-08 ENCOUNTER — Ambulatory Visit (HOSPITAL_BASED_OUTPATIENT_CLINIC_OR_DEPARTMENT_OTHER): Payer: 59 | Admitting: Oncology

## 2012-09-08 ENCOUNTER — Encounter: Payer: Self-pay | Admitting: Oncology

## 2012-09-08 ENCOUNTER — Other Ambulatory Visit (HOSPITAL_BASED_OUTPATIENT_CLINIC_OR_DEPARTMENT_OTHER): Payer: 59 | Admitting: Lab

## 2012-09-08 VITALS — BP 101/65 | HR 85 | Temp 97.2°F | Resp 19 | Ht 69.0 in | Wt 159.9 lb

## 2012-09-08 DIAGNOSIS — C6290 Malignant neoplasm of unspecified testis, unspecified whether descended or undescended: Secondary | ICD-10-CM

## 2012-09-08 LAB — CBC WITH DIFFERENTIAL/PLATELET
BASO%: 0.5 % (ref 0.0–2.0)
LYMPH%: 19 % (ref 14.0–49.0)
MCHC: 34.6 g/dL (ref 32.0–36.0)
MONO#: 0.6 10*3/uL (ref 0.1–0.9)
MONO%: 5.9 % (ref 0.0–14.0)
Platelets: 416 10*3/uL — ABNORMAL HIGH (ref 140–400)
RBC: 5.62 10*6/uL (ref 4.20–5.82)
RDW: 13.2 % (ref 11.0–14.6)
WBC: 9.4 10*3/uL (ref 4.0–10.3)

## 2012-09-08 LAB — COMPREHENSIVE METABOLIC PANEL (CC13)
ALT: 23 U/L (ref 0–55)
Alkaline Phosphatase: 107 U/L (ref 40–150)
CO2: 31 mEq/L — ABNORMAL HIGH (ref 22–29)
Sodium: 139 mEq/L (ref 136–145)
Total Bilirubin: 0.64 mg/dL (ref 0.20–1.20)
Total Protein: 7.9 g/dL (ref 6.4–8.3)

## 2012-09-08 NOTE — Progress Notes (Signed)
Checked in new patient. No financial issues. He has no living will/POA. We have email on file.

## 2012-09-08 NOTE — Progress Notes (Signed)
Reason for Referral: Testis cancer.   HPI: 27 year old gentleman currently on  referred to me for the evaluation of new diagnosis of testis cancer. He is a rather healthy 27 year-old who works as a Production designer, theatre/television/film at a [employer]. He was in his normal state of health still he noticed a right testicular mass for about the last 2 months. [place] was started and painless in character. He did have some intermittent severe pain at times but no voiding symptoms. Patient referred to to Dr. Bjorn Pippin and a scrotal ultrasound in June of 2014 showed an 8 cm mass involving the entire right testicle that is suspicious for malignancy. Based on this finding, he underwent a right radical orchiectomy and placement of a testicular prosthesis done on 08/26/2012. Patient tolerated the procedure well without any complications. The pathology from that operation showed an 8 cm seminoma with lymphovascular invasion is identified with margins were negative for any tumor. The pathological staging was T2 NX. His tumor markers showed an elevated beta hCG at 14.5 and a normal alpha-fetoprotein. His beta hCG normalized postoperatively down to 0.8. CT scan of the abdomen and pelvis which showed a right external iliac lymph node measuring 1.1 cm and aortocaval lymph node measuring 1.1 cm these were characterized as a borderline enlarged lymph nodes. Patient referred to me for further evaluation.  Clinically, Mr. Ballow is asymptomatic at this point. Is not reporting any abdominal pain or discomfort. Is not reporting any testicular pain or voiding symptoms. Is reporting any abdominal pain or early satiety. Is not reporting any hematochezia or melena. Center for any shortness of breath or difficulty breathing.   Past Medical History  Diagnosis Date  . 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 Scrotal mass     right  . History of thrombophlebitis     2004--  UPPER EXTREMITIY -- RESOLVED  :  Past  Surgical History  Procedure Laterality Date  . Maxillary le forte osteotomy and mandibular bilateral sagittal split ramus osteotomiies with rigid internal fixation  02-20-2004    SKELETAL DEFECT  . Adenoidectomy  2001  . Appendectomy  AGE 28  1998  . Orchiectomy Right 08/26/2012    Procedure: RIGHT INGUINAL ORCHIECTOMY WITH TESTICULAR PROTHESIS;  Surgeon: Anner Crete, MD;  Location: [facility];  Service: Urology;  Laterality: Right;  :  Current outpatient prescriptions:ibuprofen (ADVIL,MOTRIN) 200 MG tablet, Take 200 mg by mouth as needed. , Disp: , Rfl: :  Allergies  Allergen Reactions  . Morphine Nausea And Vomiting and Other (See Comments)    DIZZY  :  Family History  Problem Relation Age of Onset  . Diabetes Maternal Grandmother   . Diabetes Maternal Grandfather   . Diabetes Paternal Grandmother   . Heart attack Paternal Grandfather   . Hypertension Paternal Grandfather   . Diabetes Paternal Grandfather   :  History   Social History  . Marital Status: Single    Spouse Name: N/A    Number of Children: N/A  . Years of Education: N/A   Occupational History  . Not on file.   Social History Main Topics  . Smoking status: Never Smoker   . Smokeless tobacco: Never Used  . Alcohol Use: Yes     Comment: RARE  . Drug Use: No  . Sexually Active: Not on file   Other Topics Concern  . Not on file   Social History Narrative  . No  narrative on file  :  A comprehensive review of systems was negative.  Exam: Blood pressure 101/65, pulse 85, temperature 97.2 F (36.2 C), temperature source Oral, resp. rate 19, height 5\' 9"  (1.753 m), weight 159 lb 14.4 oz (72.53 kg). General appearance: alert, cooperative and appears stated age Head: Normocephalic, without obvious abnormality, atraumatic Nose: Nares normal. Septum midline. Mucosa normal. No drainage or sinus tenderness. Throat: lips, mucosa, and tongue normal; teeth and gums normal Neck: no adenopathy,  no carotid bruit, no JVD, supple, symmetrical, trachea midline and thyroid not enlarged, symmetric, no tenderness/mass/nodules Resp: clear to auscultation bilaterally Cardio: regular rate and rhythm, S1, S2 normal, no murmur, click, rub or gallop GI: soft, non-tender; bowel sounds normal; no masses,  no organomegaly Extremities: extremities normal, atraumatic, no cyanosis or edema Pulses: 2+ and symmetric Skin: Skin color, texture, turgor normal. No rashes or lesions Lymph nodes: Cervical, supraclavicular, and axillary nodes normal.   Recent Labs  09/08/12 1344  WBC 9.4  HGB 15.1  HCT 43.6  PLT 416*    Dg Chest 2 View  08/31/2012   *RADIOLOGY REPORT*  Clinical Data: Testicular carcinoma  CHEST - 2 VIEW  Comparison: February 20, 2004  Findings: Lungs clear.  Heart size and pulmonary vascularity are normal.  No adenopathy.  No bone lesions.  IMPRESSION: No abnormality noted.   Original Report Authenticated By: Bretta Bang, M.D.    Assessment and Plan:   27 year old gentleman with the following issues:  1. 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 He underwent right radical orchiectomy on 08/26/2012 with the pathology showing T2 disease with lymphovascular invasion. CT scan showed borderline enlarged right external iliac lymph node and a mild retroperitoneal lymph nodes the largest measuring 1.1 cm. Assuming that these enlarged lymph nodes a presents advanced seminoma up with a dramatic clinical stage IIA disease. A treatment option would be at this point systemic therapy with multiagent chemotherapy utilizing cisplatin etoposide and bleomycin versus radiation therapy. Given the fact that he has none mouth he disease in the toxicity associated with at least 3 cycles of chemotherapy probably favor radiation therapy at this point. If we assume that these lymph nodes are indeed reactive in nature and not involved with malignancy and that puts him at stage IB  seminoma. The treatment option for that includes surveillance, adjuvant chemotherapy with single agent carboplatin versus radiation therapy. Risks and benefits of all he is approaches were discussed and I told him that I would favor observation and surveillance but I think his overall represents high risk disease giving his lymphovascular invasion and his borderline enlarged lymph nodes and for that reason maybe treating him with radiation therapy will serve as a definitive and potentially adjuvant in nature. Alternatively, one can do an interval scan in about 3 months and check the status of these implants. It is indeed enlarging, but definite treatments these to be done with her that we'll be in the form of radiation therapy or multiagent chemotherapy. If these lymph glands are stable progressed to represent reactive lymph glands in one can continue with active surveillance. He is to consider these options and I will go ahead and refer him for radiation oncology evaluation and have set him up with a followup as well in about 3 months after a CT scan in any case. All his questions were answered today their satisfaction.  2. Behcet's syndrome: Does not appear to be active at this point.

## 2012-09-08 NOTE — Telephone Encounter (Signed)
gv and printed appt sched and avs for pt....pt sched to see Dr. Roselind Messier on 7.14.14 @ 8:30am...   gv pt barium

## 2012-09-10 ENCOUNTER — Encounter: Payer: Self-pay | Admitting: Radiation Oncology

## 2012-09-10 NOTE — Progress Notes (Signed)
GU Location of Tumor / Histology: right testical  If Prostate Cancer, Gleason Score is ( + ) and PSA is ()  Patient presented 2 months ago with signs/symptoms of: right testicular mass, intermittent severe pain  Biopsies of right testical (if applicable) revealed:  Testis, tumor, right - SEMINOMA, 8.0 CM. - LYMPHOVASCULAR INVASION IS IDENTIFIED. - INTRATUBULAR GERM CELL NEOPLASIA (ITGCN). - THE SURGICAL RESECTION MARGINS ARE NEGATIVE FOR TUMOR. - SEE ONCOLOGY TABLE BELOW.  Past/Anticipated interventions by urology, if any: right radical orchiectomy/ placement of testicular prosthesis, Dr Annabell Howells  Past/Anticipated interventions by medical oncology, if any: surveillance,  ct scan chest/abd/pelvis on 11/23/12  Weight changes, if any: none  Bowel/Bladder complaints, if any: none   Nausea/Vomiting, if any: none  Pain issues, if any:  denies  SAFETY ISSUES:  Prior radiation? no  Pacemaker/ICD? no  Possible current pregnancy? na  Is the patient on methotrexate? no  Current Complaints / other details:  Single, Higher education careers adviser in [place]

## 2012-09-13 ENCOUNTER — Ambulatory Visit
Admission: RE | Admit: 2012-09-13 | Discharge: 2012-09-13 | Disposition: A | Discharge status: Home / Self Care | Payer: 59 | Source: Ambulatory Visit | Attending: Radiation Oncology | Admitting: Radiation Oncology

## 2012-09-13 ENCOUNTER — Encounter: Payer: Self-pay | Admitting: Radiation Oncology

## 2012-09-13 VITALS — BP 98/65 | HR 79 | Temp 99.0°F | Resp 20 | Ht 69.0 in | Wt 161.1 lb

## 2012-09-13 DIAGNOSIS — C6290 Malignant neoplasm of unspecified testis, unspecified whether descended or undescended: Secondary | ICD-10-CM | POA: Insufficient documentation

## 2012-09-13 DIAGNOSIS — Z9079 Acquired absence of other genital organ(s): Secondary | ICD-10-CM | POA: Insufficient documentation

## 2012-09-13 DIAGNOSIS — M352 Behcet's disease: Secondary | ICD-10-CM | POA: Insufficient documentation

## 2012-09-13 DIAGNOSIS — C6211 Malignant neoplasm of descended right testis: Secondary | ICD-10-CM | POA: Insufficient documentation

## 2012-09-13 DIAGNOSIS — C6291 Malignant neoplasm of right testis, unspecified whether descended or undescended: Secondary | ICD-10-CM

## 2012-09-13 HISTORY — DX: Malignant neoplasm of descended right testis: C62.11

## 2012-09-13 HISTORY — DX: Malignant (primary) neoplasm, unspecified: C80.1

## 2012-09-13 NOTE — Progress Notes (Signed)
Please see the Nurse Progress Note in the MD Initial Consult Encounter for this patient. 

## 2012-09-13 NOTE — Progress Notes (Signed)
Radiation Oncology         [phone]) [phone] ________________________________  Initial outpatient Consultation  Name: Daniel Vang MRN: [id number]  Date: 09/13/2012  DOB: [birth year]  WJ:XBJYNWG [name], MD  Benjiman Core, MD , Bjorn Pippin, MD  REFERRING PHYSICIAN: Benjiman Core, MD  DIAGNOSIS: Seminoma of the right testicle (pT2, pNx)  HISTORY OF PRESENT ILLNESS::Daniel Vang is a 27 y.o. male who is seen out of the courtesy of Dr. Eli Hose for an opinion concerning radiation therapy as it relates to a new diagnosis of testicular seminoma. The patient presented with an enlarged right testicle with some associated intermittent pain. He is seen by Dr. Dallas Schimke and then promptly referred to Dr. Annabell Howells for evaluation.  Ultrasound revealed a solid mass. Preoperative beta hCG was 14 with a normal alpha-fetoprotein and LDH. Patient proceeded to undergo a right radical orchiectomy with placement of a testicular prosthesis. Patient has done well since his surgery with minimal discomfort.   Pathology revealed a 8 cm seminoma with lymphovascular space invasion. Surgical margins were clear. Beta hCG normalized postoperatively down to 0.8 The patient did undergo a postoperative CT scan of the abdomen and pelvis which revealed mildly prominent nodes in the aorto/ caval region. There was also borderline right external iliac node measuring 1.1 cm. Patient was seen in medical oncology and recommendations for consideration for radiation therapy or close observation. Patient is now seen in radiation oncology for further evaluation.   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    PAST SURGICAL HISTORY: Past Surgical History  Procedure Laterality Date  . Maxillary le forte osteotomy and mandibular bilateral sagittal split ramus osteotomiies  with rigid internal fixation  02-20-2004    SKELETAL DEFECT  . Adenoidectomy  2001  . Orchiectomy Right 08/26/2012    Procedure: RIGHT INGUINAL ORCHIECTOMY WITH TESTICULAR PROTHESIS;  Surgeon: Anner Crete, MD;  Location: [facility];  Service: Urology;  Laterality: Right;  . Appendectomy  AGE 40  1998    FAMILY HISTORY: family history includes Diabetes in his maternal grandfather, maternal grandmother, paternal grandfather, and paternal grandmother; Heart attack in his paternal grandfather; and Hypertension in his paternal grandfather.  SOCIAL HISTORY:  reports that he quit smoking about 7 years ago. He has never used smokeless tobacco. He reports that  drinks alcohol. He reports that he does not use illicit drugs.  ALLERGIES: Morphine  MEDICATIONS:  Current Outpatient Prescriptions  Medication Sig Dispense Refill  . ibuprofen (ADVIL,MOTRIN) 200 MG tablet Take 200 mg by mouth as needed.        No current facility-administered medications for this encounter.    REVIEW OF SYSTEMS:  A 15 point review of systems is documented in the electronic medical record. This was obtained by the nursing staff. However, I reviewed this with the patient to discuss relevant findings and make appropriate changes.  Patient has some mild discomfort in his surgical bed. He denies any urination difficulties or bowel complaints.  The patient has a history of Behcet's syndrome. This will occasionally  result in ulcers in the oral cavity or scrotal region. He will occasionally have flareup resulting in dryness within his eyes. Patient's never had problems with gastrointestinal issues related to this diagnosis. He has not required admission for this issue or steroid therapy but will use nonsteroidal anti-inflammatory agents for flareup issues  PHYSICAL EXAM:  height is 5\' 9"  (1.753 m) and weight is 161 lb 1.6 oz (73.074 kg). His oral temperature is 99 F (37.2 C). His blood pressure is 98/65 and his  pulse is 79. His respiration is 20.   BP 98/65  Pulse 79  Temp(Src) 99 F (37.2 C) (Oral)  Resp 20  Ht 5\' 9"  (1.753 m)  Wt 161 lb 1.6 oz (73.074 kg)  BMI 23.78 kg/m2  General Appearance:    Alert, cooperative, no distress, appears stated age, healthy-appearing   Head:    Normocephalic, without obvious abnormality, atraumatic  Eyes:    PERRL, conjunctiva/corneas clear, EOM's intact      Ears:    Normal TM's and external ear canals, both ears  Nose:   Nares normal, septum midline, mucosa normal, no drainage    or sinus tenderness  Throat:   Lips, mucosa, and tongue normal; teeth and gums normal, small  2 mm ulcer noted along the right tonsillar fossa   Neck:   Supple, symmetrical, trachea midline, no adenopathy;       thyroid:  No enlargement/tenderness/nodules; no carotid   bruit or JVD  Back:     Symmetric, no curvature, ROM normal, no CVA tenderness  Lungs:     Clear to auscultation bilaterally, respirations unlabored  Chest wall:    No tenderness or deformity  Heart:    Regular rate and rhythm,  no murmur, rub   or gallop  Abdomen:     Soft, non-tender, bowel sounds active all four quadrants,    no masses, no organomegaly, well healing scar in the right inguinal region, approximately 7 cm in length   Genitalia:    Normal male without lesion, discharge or tenderness, circumcised, prosthesis present in the right scrotal sac, the left testicle is benign      Extremities:   Extremities normal, atraumatic, no cyanosis or edema  Pulses:   2+ and symmetric all extremities  Skin:   Skin color, texture, turgor normal, no rashes or lesions  Lymph nodes:   Cervical, supraclavicular, and axillary nodes normal  Neurologic:    Normal strength, sensation and reflexes      throughout    LABORATORY DATA:  Lab Results  Component Value Date   WBC 9.4 09/08/2012   HGB 15.1 09/08/2012   HCT 43.6 09/08/2012   MCV 77.6* 09/08/2012   PLT 416* 09/08/2012   Lab Results  Component Value Date   NA 139  09/08/2012   K 4.0 09/08/2012   CL 109 05/29/2008   CO2 31* 09/08/2012   Lab Results  Component Value Date   ALT 23 09/08/2012   AST 14 09/08/2012   ALKPHOS 107 09/08/2012   BILITOT 0.64 09/08/2012     RADIOGRAPHY: Dg Chest 2 View  08/31/2012   *RADIOLOGY REPORT*  Clinical Data: Testicular carcinoma  CHEST - 2 VIEW  Comparison: February 20, 2004  Findings: Lungs clear.  Heart size and pulmonary vascularity are normal.  No adenopathy.  No bone lesions.  IMPRESSION: No abnormality noted.   Original Report Authenticated By: Bretta Bang, M.D.      IMPRESSION: Seminoma of the right testicle (pT2, pNx).  I discussed options for treatment with the patient including close observation, adjuvant chemotherapy and adjuvant radiation therapy.   We also discussed results of the patient's postoperative CT scan as it relates to this decision-making process.  He does have a history of Behcet's disease but has not had any gastrointestinal issues related to  this diagnosis. With anticipated low-dose radiation therapy this would likely not be an issue for the patient. I discussed potential side effects during radiation therapy and potential long-term toxicities related to this therapy.  After careful consideration the patient would like to proceed with radiation therapy as part of his management. This would include the right hemipelvis and periaortic area given the questionable findings on his CT scan. Patient will also have a testicular shield placed over his remaining left testicle during treatment to limit any  internal scatter radiation to the left testicle. There will be no direct radiation beam over the left testicle region.  PLAN: Simulation and planning tomorrow. I anticipate approximately 4 weeks of radiation therapy with a cumulative dose to the periaortic and right hemi- pelvis of 25 Gy.  I spent 60 minutes minutes face to face with the patient and more than 50% of that time was spent in counseling and/or  coordination of care.   ------------------------------------------------  -----------------------------------  Billie Lade, PhD, MD

## 2012-09-14 ENCOUNTER — Ambulatory Visit
Admission: RE | Admit: 2012-09-14 | Discharge: 2012-09-14 | Disposition: A | Discharge status: Home / Self Care | Payer: 59 | Source: Ambulatory Visit | Attending: Radiation Oncology | Admitting: Radiation Oncology

## 2012-09-14 DIAGNOSIS — R5381 Other malaise: Secondary | ICD-10-CM | POA: Insufficient documentation

## 2012-09-14 DIAGNOSIS — C6211 Malignant neoplasm of descended right testis: Secondary | ICD-10-CM

## 2012-09-14 DIAGNOSIS — C6290 Malignant neoplasm of unspecified testis, unspecified whether descended or undescended: Secondary | ICD-10-CM | POA: Insufficient documentation

## 2012-09-14 DIAGNOSIS — Z510 Encounter for antineoplastic radiation therapy: Secondary | ICD-10-CM | POA: Insufficient documentation

## 2012-09-14 DIAGNOSIS — R110 Nausea: Secondary | ICD-10-CM | POA: Insufficient documentation

## 2012-09-14 NOTE — Progress Notes (Signed)
  Radiation Oncology         [phone]) [phone] ________________________________  Name: Londen Bok MRN: [id number]  Date: 09/14/2012  DOB: [birth year]  SIMULATION AND TREATMENT PLANNING NOTE  DIAGNOSIS: Seminoma of the right testicle (pT2, pNx)   NARRATIVE:  The patient was brought to the CT Simulation planning suite.  Identity was confirmed.  All relevant records and images related to the planned course of therapy were reviewed.  The patient freely provided informed written consent to proceed with treatment after reviewing the details related to the planned course of therapy. The consent form was witnessed and verified by the simulation staff.  Then, the patient was set-up in a stable reproducible  supine position for radiation therapy.  CT images were obtained.  Surface markings were placed.  The CT images were loaded into the planning software.  Then the target and avoidance structures were contoured.  Treatment planning then occurred.  The radiation prescription was entered and confirmed.  Then, I designed and supervised the construction of a total of 3 medically necessary complex treatment devices.  I have requested : 3D Simulation  I have requested a DVH of the following structures: GTV, left kidney, right kidney, PTV.  I have ordered:Diode first day of treatment for dose estimate to the left testicle underneath the testicular shield.  PLAN:  The patient will receive 25 Gy in 20 fractions.  ________________________________  -----------------------------------  Billie Lade, PhD, MD

## 2012-09-21 ENCOUNTER — Ambulatory Visit
Admission: RE | Admit: 2012-09-21 | Discharge: 2012-09-21 | Disposition: A | Discharge status: Home / Self Care | Payer: 59 | Source: Ambulatory Visit | Attending: Radiation Oncology | Admitting: Radiation Oncology

## 2012-09-22 ENCOUNTER — Ambulatory Visit
Admission: RE | Admit: 2012-09-22 | Discharge: 2012-09-22 | Disposition: A | Discharge status: Home / Self Care | Payer: 59 | Source: Ambulatory Visit | Attending: Radiation Oncology | Admitting: Radiation Oncology

## 2012-09-22 DIAGNOSIS — C6291 Malignant neoplasm of right testis, unspecified whether descended or undescended: Secondary | ICD-10-CM

## 2012-09-22 NOTE — Progress Notes (Signed)
Post sim ed completed w/pt and father. Gave pt "Radiation and You" booklet w/all pertinent information marked and discussed, re: diarrhea, fatigue, nausea, skin irritation/care, nutrition, pain. All questions answered. Pt verbalized understanding.

## 2012-09-23 ENCOUNTER — Other Ambulatory Visit: Payer: Self-pay | Admitting: Radiation Oncology

## 2012-09-23 ENCOUNTER — Ambulatory Visit
Admission: RE | Admit: 2012-09-23 | Discharge: 2012-09-23 | Disposition: A | Discharge status: Home / Self Care | Payer: 59 | Source: Ambulatory Visit | Attending: Radiation Oncology | Admitting: Radiation Oncology

## 2012-09-24 ENCOUNTER — Ambulatory Visit
Admission: RE | Admit: 2012-09-24 | Discharge: 2012-09-24 | Disposition: A | Discharge status: Home / Self Care | Payer: 59 | Source: Ambulatory Visit | Attending: Radiation Oncology | Admitting: Radiation Oncology

## 2012-09-27 ENCOUNTER — Encounter: Payer: Self-pay | Admitting: *Deleted

## 2012-09-27 ENCOUNTER — Ambulatory Visit
Admission: RE | Admit: 2012-09-27 | Discharge: 2012-09-27 | Disposition: A | Discharge status: Home / Self Care | Payer: 59 | Source: Ambulatory Visit | Attending: Radiation Oncology | Admitting: Radiation Oncology

## 2012-09-28 ENCOUNTER — Encounter: Payer: Self-pay | Admitting: Radiation Oncology

## 2012-09-28 ENCOUNTER — Ambulatory Visit
Admission: RE | Admit: 2012-09-28 | Discharge: 2012-09-28 | Disposition: A | Discharge status: Home / Self Care | Payer: 59 | Source: Ambulatory Visit | Attending: Radiation Oncology | Admitting: Radiation Oncology

## 2012-09-28 VITALS — BP 111/64 | HR 88 | Resp 16 | Wt 157.3 lb

## 2012-09-28 DIAGNOSIS — C6211 Malignant neoplasm of descended right testis: Secondary | ICD-10-CM

## 2012-09-28 NOTE — Progress Notes (Signed)
[facility]    Radiation Oncology [address] [place]     Maryln Gottron, M.D. [place], [place] [address]               Billie Lade, M.D., Ph.D. Phone: [phone]      Molli Hazard A. Kathrynn Running, M.D. Fax: [phone]      Radene Gunning, M.D., Ph.D.         Lurline Hare, M.D.         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  Current Dose: 6.25 Gy  Current Fraction: 5  Planned Dose: 25 Gy  Narrative: [name] was seen today for weekly treatment management. The chart was checked and port films  were reviewed. He has had some nausea with his first few treatments. Patient was given Compazine which has helped this issue. He takes his prior to his radiation treatment.  he continues to work full-time.  Morphine Current Outpatient Prescriptions  Medication Sig Dispense Refill  . ibuprofen (ADVIL,MOTRIN) 200 MG tablet Take 200 mg by mouth as needed.       . prochlorperazine (COMPAZINE) 10 MG tablet Take 10 mg by mouth every 6 (six) hours as needed.       No current facility-administered medications for this encounter.   Labs:  Lab Results  Component Value Date   WBC 9.4 09/08/2012   HGB 15.1 09/08/2012   HCT 43.6 09/08/2012   MCV 77.6* 09/08/2012   PLT 416* 09/08/2012   Lab Results  Component Value Date   CREATININE 0.9 09/08/2012   BUN 9.3 09/08/2012   NA 139 09/08/2012   K 4.0 09/08/2012   CL 109 05/29/2008   CO2 31* 09/08/2012   Lab Results  Component Value Date   ALT 23 09/08/2012   AST 14 09/08/2012   BILITOT 0.64 09/08/2012    Physical Examination:  weight is 157 lb 4.8 oz (71.351 kg). His blood pressure is 111/64 and his pulse is 88. His respiration is 16.    Wt Readings from Last 3 Encounters:  09/28/12 157 lb 4.8  oz (71.351 kg)  09/13/12 161 lb 1.6 oz (73.074 kg)  09/08/12 159 lb 14.4 oz (72.53 kg)     Lungs - Normal respiratory effort, chest expands symmetrically. Lungs are clear to auscultation, no crackles or wheezes.  Heart has regular rhythm and rate  Abdomen is soft and non tender with normal bowel sounds  Assessment:  Patient tolerating treatments well except for above issue.  Plan: Continue treatment per original radiation prescription

## 2012-09-28 NOTE — Progress Notes (Signed)
Reports that he has been dealing with nausea for several day but, denies any today. Reports low back pain 2 on a scale of 0-10 that just started today. Denies taking any OTC pain medication to relieve this back pain. Denies dysuria or hematuria. Denies fatigue.

## 2012-09-29 ENCOUNTER — Ambulatory Visit
Admission: RE | Admit: 2012-09-29 | Discharge: 2012-09-29 | Disposition: A | Discharge status: Home / Self Care | Payer: 59 | Source: Ambulatory Visit | Attending: Radiation Oncology | Admitting: Radiation Oncology

## 2012-09-30 ENCOUNTER — Ambulatory Visit
Admission: RE | Admit: 2012-09-30 | Discharge: 2012-09-30 | Disposition: A | Discharge status: Home / Self Care | Payer: 59 | Source: Ambulatory Visit | Attending: Radiation Oncology | Admitting: Radiation Oncology

## 2012-10-01 ENCOUNTER — Ambulatory Visit
Admission: RE | Admit: 2012-10-01 | Discharge: 2012-10-01 | Disposition: A | Discharge status: Home / Self Care | Payer: 59 | Source: Ambulatory Visit | Attending: Radiation Oncology | Admitting: Radiation Oncology

## 2012-10-04 ENCOUNTER — Ambulatory Visit
Admission: RE | Admit: 2012-10-04 | Discharge: 2012-10-04 | Disposition: A | Discharge status: Home / Self Care | Payer: 59 | Source: Ambulatory Visit | Attending: Radiation Oncology | Admitting: Radiation Oncology

## 2012-10-05 ENCOUNTER — Ambulatory Visit
Admission: RE | Admit: 2012-10-05 | Discharge: 2012-10-05 | Disposition: A | Discharge status: Home / Self Care | Payer: 59 | Source: Ambulatory Visit | Attending: Radiation Oncology | Admitting: Radiation Oncology

## 2012-10-05 ENCOUNTER — Encounter: Payer: Self-pay | Admitting: Radiation Oncology

## 2012-10-05 VITALS — BP 112/63 | HR 91 | Temp 98.9°F | Resp 20 | Wt 156.7 lb

## 2012-10-05 DIAGNOSIS — C6291 Malignant neoplasm of right testis, unspecified whether descended or undescended: Secondary | ICD-10-CM

## 2012-10-05 MED ORDER — ONDANSETRON HCL 4 MG PO TABS: 4.0000 mg | ORAL_TABLET | Freq: Three times a day (TID) | ORAL | Status: DC | PRN

## 2012-10-05 NOTE — Progress Notes (Signed)
Weekly Management Note Current Dose: 12.5  Gy  Projected Dose: 25 Gy   Narrative:  The patient presents for routine under treatment assessment.  CBCT/MVCT images/Port film x-rays were reviewed.  The chart was checked. Doing well. Nausea on Saturdays. Compazine not helping.   Physical Findings: Weight: 156 lb 11.2 oz (71.079 kg). Unchanged  Impression:  The patient is tolerating radiation.  Plan:  Continue treatment as planned. Try zofran 4-8 mg q8 hours prn.

## 2012-10-05 NOTE — Progress Notes (Signed)
Pt denies pain, urination issues. He reports nausea unrelieved w/Compazine, denies vomiting, is sleeping longer hours, loose stools occasionally but not daily. Pt requests another med for nausea stating he cannot eat some meals due to nausea.

## 2012-10-06 ENCOUNTER — Ambulatory Visit
Admission: RE | Admit: 2012-10-06 | Discharge: 2012-10-06 | Disposition: A | Discharge status: Home / Self Care | Payer: 59 | Source: Ambulatory Visit | Attending: Radiation Oncology | Admitting: Radiation Oncology

## 2012-10-07 ENCOUNTER — Telehealth: Payer: Self-pay | Admitting: *Deleted

## 2012-10-07 ENCOUNTER — Ambulatory Visit
Admission: RE | Admit: 2012-10-07 | Discharge: 2012-10-07 | Disposition: A | Discharge status: Home / Self Care | Payer: 59 | Source: Ambulatory Visit | Attending: Radiation Oncology | Admitting: Radiation Oncology

## 2012-10-07 ENCOUNTER — Telehealth: Payer: Self-pay | Admitting: Oncology

## 2012-10-07 ENCOUNTER — Encounter: Payer: Self-pay | Admitting: *Deleted

## 2012-10-07 NOTE — Telephone Encounter (Signed)
Received a call from [employer] at [employer].  She stated that Daniel Vang's mother was requesting the oral disintegrating tablets of Zofran.  She stated that he is not able to swallow the tablets.  He has already picked up his other prescription for Zofran so he would need a new prescription.

## 2012-10-07 NOTE — Telephone Encounter (Signed)
Returned call to [facility] [phone],  Spoke with Victorino Dike, per Dr. Michell Heinrich, "okay to switch to 4mg  ODT Zofran  From original RX, can take 1-2 tabs ebvery 8 hours prn, dispense #30 tabs, no refills ordered", Victorino Dike will call patient,thanked [place] for return call back today 4:39 PM

## 2012-10-08 ENCOUNTER — Ambulatory Visit
Admission: RE | Admit: 2012-10-08 | Discharge: 2012-10-08 | Disposition: A | Discharge status: Home / Self Care | Payer: 59 | Source: Ambulatory Visit | Attending: Radiation Oncology | Admitting: Radiation Oncology

## 2012-10-11 ENCOUNTER — Ambulatory Visit
Admission: RE | Admit: 2012-10-11 | Discharge: 2012-10-11 | Disposition: A | Discharge status: Home / Self Care | Payer: 59 | Source: Ambulatory Visit | Attending: Radiation Oncology | Admitting: Radiation Oncology

## 2012-10-12 ENCOUNTER — Ambulatory Visit
Admission: RE | Admit: 2012-10-12 | Discharge: 2012-10-12 | Disposition: A | Discharge status: Home / Self Care | Payer: 59 | Source: Ambulatory Visit | Attending: Radiation Oncology | Admitting: Radiation Oncology

## 2012-10-12 ENCOUNTER — Encounter: Payer: Self-pay | Admitting: Radiation Oncology

## 2012-10-12 VITALS — BP 113/62 | HR 83 | Temp 97.7°F | Resp 20 | Wt 157.9 lb

## 2012-10-12 DIAGNOSIS — C6211 Malignant neoplasm of descended right testis: Secondary | ICD-10-CM

## 2012-10-12 NOTE — Progress Notes (Signed)
[facility]    Radiation Oncology [address] [place]     Maryln Gottron, M.D. [place], [place] [address]               Billie Lade, M.D., Ph.D. Phone: [phone]      Molli Hazard A. Kathrynn Running, M.D. Fax: [phone]      Radene Gunning, M.D., Ph.D.         Lurline Hare, M.D.         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  Current Dose: 18.75 Gy  Current Fraction: 15  Planned Dose: 25 Gy  Narrative: Daniel Vang was seen today for weekly treatment management. The chart was checked and port films  were reviewed. He continues to have problems with nausea since starting on Zofran this is much better for him. He denies any problems with diarrhea. He does have some fatigue but continues to work full-time.  Morphine Current Outpatient Prescriptions  Medication Sig Dispense Refill  . ibuprofen (ADVIL,MOTRIN) 200 MG tablet Take 200 mg by mouth as needed.       . ondansetron (ZOFRAN) 4 MG tablet Take 1 tablet (4 mg total) by mouth every 8 (eight) hours as needed for nausea.  30 tablet  1   No current facility-administered medications for this encounter.   Labs:  Lab Results  Component Value Date   WBC 9.4 09/08/2012   HGB 15.1 09/08/2012   HCT 43.6 09/08/2012   MCV 77.6* 09/08/2012   PLT 416* 09/08/2012   Lab Results  Component Value Date   CREATININE 0.9 09/08/2012   BUN 9.3 09/08/2012   NA 139 09/08/2012   K 4.0 09/08/2012   CL 109 05/29/2008   CO2 31* 09/08/2012   Lab Results  Component Value Date   ALT 23 09/08/2012   AST 14 09/08/2012   BILITOT 0.64 09/08/2012    Physical Examination:  weight is 157 lb 14.4 oz (71.623 kg). His oral temperature is 97.7 F (36.5 C). His blood pressure is 113/62 and his pulse is 83. His  respiration is 20.    Wt Readings from Last 3 Encounters:  10/12/12 157 lb 14.4 oz (71.623 kg)  10/05/12 156 lb 11.2 oz (71.079 kg)  09/28/12 157 lb 4.8 oz (71.351 kg)     Lungs - Normal respiratory effort, chest expands symmetrically. Lungs are clear to auscultation, no crackles or wheezes.  Heart has regular rhythm and rate  Abdomen is soft and non tender with normal bowel sounds  Assessment:  Patient tolerating treatments well  Plan: Continue treatment per original radiation prescription

## 2012-10-12 NOTE — Progress Notes (Signed)
Pt denies pain today but states he occasionally has stomach cramping that goes away quickly. He denies nausea, bowel or urinary issues, doe have some loss of appetite. He is fatigued but continues to work full time.

## 2012-10-13 ENCOUNTER — Ambulatory Visit
Admission: RE | Admit: 2012-10-13 | Discharge: 2012-10-13 | Disposition: A | Discharge status: Home / Self Care | Payer: 59 | Source: Ambulatory Visit | Attending: Radiation Oncology | Admitting: Radiation Oncology

## 2012-10-14 ENCOUNTER — Ambulatory Visit
Admission: RE | Admit: 2012-10-14 | Discharge: 2012-10-14 | Disposition: A | Discharge status: Home / Self Care | Payer: 59 | Source: Ambulatory Visit | Attending: Radiation Oncology | Admitting: Radiation Oncology

## 2012-10-15 ENCOUNTER — Ambulatory Visit
Admission: RE | Admit: 2012-10-15 | Discharge: 2012-10-15 | Disposition: A | Discharge status: Home / Self Care | Payer: 59 | Source: Ambulatory Visit | Attending: Radiation Oncology | Admitting: Radiation Oncology

## 2012-10-18 ENCOUNTER — Ambulatory Visit
Admission: RE | Admit: 2012-10-18 | Discharge: 2012-10-18 | Disposition: A | Discharge status: Home / Self Care | Payer: 59 | Source: Ambulatory Visit | Attending: Radiation Oncology | Admitting: Radiation Oncology

## 2012-10-19 ENCOUNTER — Ambulatory Visit
Admission: RE | Admit: 2012-10-19 | Discharge: 2012-10-19 | Disposition: A | Discharge status: Home / Self Care | Payer: 59 | Source: Ambulatory Visit | Attending: Radiation Oncology | Admitting: Radiation Oncology

## 2012-10-19 ENCOUNTER — Encounter: Payer: Self-pay | Admitting: Radiation Oncology

## 2012-10-19 VITALS — BP 114/66 | HR 87 | Temp 98.0°F | Resp 20 | Wt 158.8 lb

## 2012-10-19 DIAGNOSIS — C6211 Malignant neoplasm of descended right testis: Secondary | ICD-10-CM

## 2012-10-19 NOTE — Progress Notes (Signed)
  Radiation Oncology         [phone]) [phone] ________________________________  Name: Daniel Vang MRN: [id number]  Date: 10/19/2012  DOB: Sep 05, 1985  Simulation Verification Note performed on 09/21/2012  Status: outpatient  NARRATIVE: The patient was brought to the treatment unit and placed in the planned treatment position. The clinical setup was verified. Then port films were obtained and uploaded to the radiation oncology medical record software.  The treatment beams were carefully compared against the planned radiation fields. The position location and shape of the radiation fields was reviewed. They targeted volume of tissue appears to be appropriately covered by the radiation beams. Organs at risk appear to be excluded as planned.  Based on my personal review, I approved the simulation verification. The patient's treatment will proceed as planned.  -----------------------------------  Billie Lade, PhD, MD

## 2012-10-19 NOTE — Progress Notes (Signed)
Pt reports fatigue, nausea. He is taking Zofran regularly. He continues to work full time, sleeping more. Pt denies other problems. Pt completed today, has FU card.

## 2012-10-19 NOTE — Progress Notes (Signed)
[facility]    Radiation Oncology [address] [place]     Maryln Gottron, M.D. [place], [place] [address]               Billie Lade, M.D., Ph.D. Phone: [phone]      Molli Hazard A. Kathrynn Running, M.D. Fax: [phone]      Radene Gunning, M.D., Ph.D.         Lurline Hare, M.D.         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  Current Dose: 25 Gy  Current Fraction: 20  Planned Dose: 25 Gy  Narrative: [name] was seen today for weekly treatment management. The chart was checked and port films  were reviewed. He is happy to complete his treatments. He is accompanied by his mother and father on evaluation today.  He continues to have some nausea with his treatment. He takes Zofran prior to his treatment and afterward. He has no nausea over the weekends. He denies any problems with diarrhea.  Morphine Current Outpatient Prescriptions  Medication Sig Dispense Refill  . ibuprofen (ADVIL,MOTRIN) 200 MG tablet Take 200 mg by mouth as needed.       . ondansetron (ZOFRAN) 4 MG tablet Take 1 tablet (4 mg total) by mouth every 8 (eight) hours as needed for nausea.  30 tablet  1   No current facility-administered medications for this encounter.     Physical Examination:  weight is 158 lb 12.8 oz (72.031 kg). His oral temperature is 98 F (36.7 C). His blood pressure is 114/66 and his pulse is 87. His respiration is 20.    Wt Readings from Last 3 Encounters:  10/19/12 158 lb 12.8 oz (72.031 kg)  10/12/12 157 lb 14.4 oz (71.623 kg)  10/05/12 156 lb 11.2 oz (71.079 kg)     Lungs - Normal respiratory effort, chest expands symmetrically. Lungs are clear to auscultation, no crackles or wheezes.  Heart has regular rhythm  and rate  Abdomen is soft and non tender with normal bowel sounds  Assessment:  Patient tolerated treatments well  Plan: Routine followup in one month.

## 2012-10-19 NOTE — Progress Notes (Signed)
  Radiation Oncology         [phone]) [phone] ________________________________  Name: Daniel Vang MRN: [id number]  Date: 10/19/2012  DOB: 12-27-1985  End of Treatment Note  DIAGNOSIS: Seminoma of the right testicle (pT2, pNx)  Indication for treatment:  Definitive, postop       Radiation treatment dates:   July 23 through August 19  Site/dose:   Periaortic and right hemipelvis, 25 Gy in 20 fractions   Beams/energy:   AP PA with a combination of 18 and 6 MV photons  Narrative: The patient tolerated radiation treatment relatively well.   He did have some nausea with his treatments requiring Zofran. Patient continued to work full-time during his treatment. he denied any problems with diarrhea.  Plan: The patient has completed radiation treatment. The patient will return to radiation oncology clinic for routine followup in one month. I advised them to call or return sooner if they have any questions or concerns related to their recovery or treatment.  -----------------------------------  Billie Lade, PhD, MD

## 2012-11-19 ENCOUNTER — Encounter: Payer: Self-pay | Admitting: Oncology

## 2012-11-22 ENCOUNTER — Ambulatory Visit
Admission: RE | Admit: 2012-11-22 | Discharge: 2012-11-22 | Disposition: A | Discharge status: Home / Self Care | Payer: 59 | Source: Ambulatory Visit | Attending: Radiation Oncology | Admitting: Radiation Oncology

## 2012-11-22 VITALS — BP 113/64 | HR 102 | Temp 98.4°F | Wt 156.8 lb

## 2012-11-22 DIAGNOSIS — C6211 Malignant neoplasm of descended right testis: Secondary | ICD-10-CM

## 2012-11-22 NOTE — Progress Notes (Signed)
  Radiation Oncology         [phone]) [phone] ________________________________  Name: Daniel Vang MRN: [id number]  Date: 11/22/2012  DOB: 09-05-1985  Follow-Up Visit Note  CC: [name], MD  Benjiman Core, MD  Diagnosis:   Seminoma of the right testicle  Interval Since Last Radiation:  1  months  Narrative:  The patient returns today for routine follow-up.  He is doing well without complaints. He denies any further fatigue nausea or diarrhea.                              ALLERGIES:  is allergic to morphine.  Meds: Current Outpatient Prescriptions  Medication Sig Dispense Refill  . ibuprofen (ADVIL,MOTRIN) 200 MG tablet Take 200 mg by mouth as needed.        No current facility-administered medications for this encounter.    Physical Findings: The patient is in no acute distress. Patient is alert and oriented.  weight is 156 lb 12.8 oz (71.124 kg). His temperature is 98.4 F (36.9 C). His blood pressure is 113/64 and his pulse is 102. His oxygen saturation is 99%. . No palpable supraclavicular or axillary adenopathy. The lungs are clear to auscultation. The heart has regular rhythm and rate. The abdomen is soft and nontender with normal bowel sounds.  Lab Findings: Lab Results  Component Value Date   WBC 9.4 09/08/2012   HGB 15.1 09/08/2012   HCT 43.6 09/08/2012   MCV 77.6* 09/08/2012   PLT 416* 09/08/2012     Impression:  The patient is recovering from the effects of radiation.  No evidence of recurrence on clinical exam today  Plan:  Routine followup in 6 months. Patient will undergo CT scan September 23 and followup with medical oncology soon afterwards. It also will followup with urology in one month.  _____________________________________  -----------------------------------  Billie Lade, PhD, MD

## 2012-11-22 NOTE — Progress Notes (Signed)
Patient here for one month follow up completion of radiation to periaortic and right hemipelvis.Denies pain or nausea.Back to normal routine and work schedule.Fatique resolved.Scheduled for ct on 11/23/12 and follow up with  Dr.Shadad for results on Friday 11/26/12.

## 2012-11-23 ENCOUNTER — Ambulatory Visit (HOSPITAL_COMMUNITY)
Admission: RE | Admit: 2012-11-23 | Discharge: 2012-11-23 | Disposition: A | Discharge status: Home / Self Care | Payer: 59 | Source: Ambulatory Visit | Attending: Oncology | Admitting: Oncology

## 2012-11-23 ENCOUNTER — Encounter (HOSPITAL_COMMUNITY): Payer: Self-pay

## 2012-11-23 ENCOUNTER — Other Ambulatory Visit: Payer: 59 | Admitting: Lab

## 2012-11-23 DIAGNOSIS — C6290 Malignant neoplasm of unspecified testis, unspecified whether descended or undescended: Secondary | ICD-10-CM

## 2012-11-23 LAB — COMPREHENSIVE METABOLIC PANEL (CC13)
Albumin: 3.9 g/dL (ref 3.5–5.0)
Alkaline Phosphatase: 92 U/L (ref 40–150)
CO2: 25 mEq/L (ref 22–29)
Glucose: 91 mg/dl (ref 70–140)
Potassium: 4.2 mEq/L (ref 3.5–5.1)
Sodium: 140 mEq/L (ref 136–145)
Total Protein: 7.5 g/dL (ref 6.4–8.3)

## 2012-11-23 LAB — CBC WITH DIFFERENTIAL/PLATELET
Basophils Absolute: 0 10*3/uL (ref 0.0–0.1)
Eosinophils Absolute: 0.1 10*3/uL (ref 0.0–0.5)
HGB: 15.9 g/dL (ref 13.0–17.1)
LYMPH%: 7 % — ABNORMAL LOW (ref 14.0–49.0)
MCV: 80.1 fL (ref 79.3–98.0)
MONO%: 12.2 % (ref 0.0–14.0)
NEUT#: 3.4 10*3/uL (ref 1.5–6.5)
Platelets: 227 10*3/uL (ref 140–400)

## 2012-11-23 MED ORDER — IOHEXOL 300 MG/ML  SOLN
100.0000 mL | Freq: Once | INTRAMUSCULAR | Status: AC | PRN
  Administered 2012-11-23: 100 mL via INTRAVENOUS

## 2012-11-26 ENCOUNTER — Telehealth: Payer: Self-pay | Admitting: Oncology

## 2012-11-26 ENCOUNTER — Ambulatory Visit (HOSPITAL_BASED_OUTPATIENT_CLINIC_OR_DEPARTMENT_OTHER): Payer: 59 | Admitting: Oncology

## 2012-11-26 VITALS — BP 111/58 | HR 96 | Temp 97.9°F | Resp 20 | Ht 69.0 in | Wt 156.2 lb

## 2012-11-26 DIAGNOSIS — C6290 Malignant neoplasm of unspecified testis, unspecified whether descended or undescended: Secondary | ICD-10-CM

## 2012-11-26 NOTE — Telephone Encounter (Signed)
gv pt appt schedule March 2015 and referral for cxr to be done same day as lb 05/17/13. Pt to see FS 05/24/13.

## 2012-11-26 NOTE — Progress Notes (Signed)
Hematology and Oncology Follow Up Visit  Daniel Vang [id number] [birth year] 27 y.o. 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 He had a T2 tumor with a 1.1 cm enlarged lymph node.   Prior Therapy:  He is status post right radical orchiectomy on 08/26/2012. He also received radiation therapy for a total of 25 gray in 20 fractions.  Current therapy: Observation and surveillance.  Interim History:  Daniel Vang presents today for a followup visit. He is a young healthy man with the above diagnosis presents today for clinical surveillance on followup. Since his last visit, he have completed radiation therapy without any complication. He did not report any nausea or vomiting. Does not report any diarrhea or abdominal pain. He did have some mild fatigue but otherwise completely asymptomatic. Is not reporting any lymphadenopathy or dysphagia. Has not reported any change in his performance status or activity level. Has not reported any GI symptoms.  Medications: I have reviewed the patient's current medications.  Current Outpatient Prescriptions  Medication Sig Dispense Refill  . ibuprofen (ADVIL,MOTRIN) 200 MG tablet Take 200 mg by mouth as needed.        No current facility-administered medications for this visit.     Allergies:  Allergies  Allergen Reactions  . Morphine Nausea And Vomiting and Other (See Comments)    DIZZY    Past Medical History, Surgical history, Social history, and Family History were reviewed and updated.  Review of Systems:  Remaining ROS negative. Physical Exam: Blood pressure 111/58, pulse 96, temperature 97.9 F (36.6 C), temperature source Oral, resp. rate 20, height 5\' 9"  (1.753 m), weight 156 lb 3.2 oz (70.852 kg). ECOG: 0 General appearance: alert, cooperative and appears stated age Head: Normocephalic, without obvious abnormality,  atraumatic Neck: no adenopathy, no carotid bruit, no JVD, supple, symmetrical, trachea midline and thyroid not enlarged, symmetric, no tenderness/mass/nodules Lymph nodes: Cervical, supraclavicular, and axillary nodes normal. Heart:regular rate and rhythm, S1, S2 normal, no murmur, click, rub or gallop Lung:chest clear, no wheezing, rales, normal symmetric air entry Abdomin: soft, non-tender, without masses or organomegaly EXT:no erythema, induration, or nodules   Lab Results: Lab Results  Component Value Date   WBC 4.4 11/23/2012   HGB 15.9 11/23/2012   HCT 45.9 11/23/2012   MCV 80.1 11/23/2012   PLT 227 11/23/2012     Chemistry      Component Value Date/Time   NA 140 11/23/2012 0846   NA 144 05/29/2008 0948   K 4.2 11/23/2012 0846   K 4.2 05/29/2008 0948   CL 109 05/29/2008 0948   CO2 25 11/23/2012 0846   CO2 31 05/29/2008 0948   BUN 8.7 11/23/2012 0846   BUN 10 05/29/2008 0948   CREATININE 0.8 11/23/2012 0846   CREATININE 1.0 05/29/2008 0948      Component Value Date/Time   CALCIUM 9.7 11/23/2012 0846   CALCIUM 9.4 05/29/2008 0948   ALKPHOS 92 11/23/2012 0846   ALKPHOS 81 05/29/2008 0948   AST 17 11/23/2012 0846   AST 20 05/29/2008 0948   ALT 22 11/23/2012 0846   ALT 19 05/29/2008 0948   BILITOT 1.38* 11/23/2012 0846   BILITOT 2.0* 05/29/2008 0948     Results for Daniel, Vang (MRN [id number]) as of 11/26/2012 15:00  Ref. Range 11/23/2012 08:46  AFP-Tumor Marker Latest Range: 0.0-8.0 ng/mL <1.3  Beta hCG, Tumor Marker Latest Range: <5.0 mIU/mL 0.5  Radiological Studies:  EXAM:  CT CHEST, ABDOMEN, AND PELVIS WITH CONTRAST  TECHNIQUE:  Multidetector CT imaging of the chest, abdomen and pelvis was  performed following the standard protocol during bolus  administration of intravenous contrast.  CONTRAST: OMNIPAQUE IOHEXOL 300 MG/ML SOLN  COMPARISON: 08/31/2012  FINDINGS:  CT CHEST FINDINGS  No pleural effusion identified appear there is no airspace  consolidation  identified. No pulmonary nodule or mass identified.  The heart is normal in size. No pericardial effusion. No mediastinal  or hilar adenopathy. No axillary or supraclavicular adenopathy.  Review of the visualized osseous structures is negative for  aggressive lytic or sclerotic bone lesion.  CT ABDOMEN AND PELVIS FINDINGS  There is no focal liver abnormality. The gallbladder is normal. No  biliary dilatation identified. Normal appearance of the spleen. The  adrenal glands are both normal. Normal appearance of the kidneys.  The urinary bladder appears within normal limits.  The abdominal aorta has a normal caliber. There is no  retroperitoneal adenopathy. There is no pelvic or inguinal  adenopathy. No free fluid or fluid collection within the abdomen or  pelvis.  The stomach is normal. The small bowel loops have a normal course  and caliber and there is no evidence for obstruction. Normal  appearance of the colon.  Review of the visualized osseous structures is on unremarkable.  IMPRESSION:  CT CHEST IMPRESSION  No acute findings and no evidence for metastatic disease.  CT ABDOMEN AND PELVIS IMPRESSION  No acute findings and no evidence for metastatic disease.   Impression and Plan:  27 year old gentleman with the following issues:  1. 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 He is status post orchiectomy followed by radiation therapy. CT scan from 11/23/2012 was discussed as well as his laboratory data. He has no clear evidence to suggest recurrent or relapsed disease and no further intervention is needed. However, he will need continuous active surveillance long term given the tendency of seminoma is to recur later in the course of this disease. I will set up a surveillance program for him which he is willing to adhere to. He will return in about 6 months for laboratory testing any chest x-ray was physical examination.  2. Enlarged iliac  lymph nodes: This have resolved at this point based on the CT scan done on 11/23/2012 we will continue to follow this in the future as well.   [facility], MD 9/26/20143:14 PM

## 2012-12-07 ENCOUNTER — Ambulatory Visit (INDEPENDENT_AMBULATORY_CARE_PROVIDER_SITE_OTHER): Payer: 59 | Admitting: Family Medicine

## 2012-12-07 ENCOUNTER — Encounter: Payer: Self-pay | Admitting: Family Medicine

## 2012-12-07 VITALS — BP 110/80 | HR 112 | Temp 98.9°F | Ht 69.0 in | Wt 151.8 lb

## 2012-12-07 DIAGNOSIS — R000 Tachycardia, unspecified: Secondary | ICD-10-CM

## 2012-12-07 DIAGNOSIS — B9789 Other viral agents as the cause of diseases classified elsewhere: Secondary | ICD-10-CM

## 2012-12-07 DIAGNOSIS — A084 Viral intestinal infection, unspecified: Secondary | ICD-10-CM | POA: Insufficient documentation

## 2012-12-07 DIAGNOSIS — B349 Viral infection, unspecified: Secondary | ICD-10-CM

## 2012-12-07 DIAGNOSIS — A088 Other specified intestinal infections: Secondary | ICD-10-CM

## 2012-12-07 NOTE — Progress Notes (Signed)
  Subjective:    Patient ID: Daniel Vang, male    DOB: 1985-09-10, 27 y.o.   MRN: [id number]  HPI  27 year old male pt of Dr. Cyndie Chime with history of testicular cancer ( dx in 08/2012, treated surgically, and given radiation, completed 10/2012)  presents with  new onset vomiting this AM.   He also had diarrhea this AM. He has had a cough intermittent in last few days, occ nausea. Dry cough.  No congestion, no ear pain, no sinus pressure, no headahce.  No SOB, no wheeze.  Mild low back pain. No fever. No abdominal pain. He has had difficult sleeping in last few days.  Has been under a lot of stress and working  A lot lately.  Trying to drink liquids.     Review of Systems  Constitutional: Positive for fatigue. Negative for fever.  HENT: Negative for ear pain.   Eyes: Negative for pain.  Respiratory: Negative for shortness of breath.   Cardiovascular: Negative for chest pain.  Gastrointestinal: Negative for abdominal pain.       Objective:   Physical Exam  Constitutional: Vital signs are normal. He appears well-developed and well-nourished.  HENT:  Head: Normocephalic.  Right Ear: Hearing normal.  Left Ear: Hearing normal.  Nose: Nose normal.  Mouth/Throat: Oropharynx is clear and moist and mucous membranes are normal.  Neck: Trachea normal. Carotid bruit is not present. No mass and no thyromegaly present.  Cardiovascular: Normal rate, regular rhythm and normal pulses.  Exam reveals no gallop, no distant heart sounds and no friction rub.   No murmur heard. No peripheral edema  Pulmonary/Chest: Effort normal and breath sounds normal. No respiratory distress.  Abdominal: There is tenderness in the right lower quadrant.  Mild pain in RLQ.. S/p cholecystectomy remotely.  Skin: Skin is warm, dry and intact. No rash noted.  Psychiatric: He has a normal mood and affect. His speech is normal and behavior is normal. Thought content normal.          Assessment & Plan:

## 2012-12-07 NOTE — Assessment & Plan Note (Signed)
Symptomatic care.  Push fluids,rest. Pt without nausea now... Not interested in antiemetic.

## 2012-12-07 NOTE — Patient Instructions (Addendum)
Work on rest and increasing fluids.  Decrease stress as able.  If not feeling better in 3-5 days.. Call for further recommendations or to see Dr. Patsy Lager.

## 2012-12-07 NOTE — Assessment & Plan Note (Signed)
Likely mild dehydration vs anxiety. Pt baseline pulse is high. Push fluids.

## 2012-12-10 ENCOUNTER — Ambulatory Visit (INDEPENDENT_AMBULATORY_CARE_PROVIDER_SITE_OTHER): Payer: 59 | Admitting: Family Medicine

## 2012-12-10 ENCOUNTER — Encounter: Payer: Self-pay | Admitting: Family Medicine

## 2012-12-10 VITALS — BP 100/62 | HR 114 | Temp 98.1°F | Ht 69.0 in | Wt 150.2 lb

## 2012-12-10 DIAGNOSIS — R112 Nausea with vomiting, unspecified: Secondary | ICD-10-CM

## 2012-12-10 DIAGNOSIS — A088 Other specified intestinal infections: Secondary | ICD-10-CM

## 2012-12-10 DIAGNOSIS — A084 Viral intestinal infection, unspecified: Secondary | ICD-10-CM

## 2012-12-10 LAB — CBC WITH DIFFERENTIAL/PLATELET
Basophils Relative: 0.6 % (ref 0.0–3.0)
Eosinophils Absolute: 0.4 10*3/uL (ref 0.0–0.7)
HCT: 48.6 % (ref 39.0–52.0)
Hemoglobin: 16.8 g/dL (ref 13.0–17.0)
Lymphs Abs: 3.7 10*3/uL (ref 0.7–4.0)
MCHC: 34.5 g/dL (ref 30.0–36.0)
MCV: 80.4 fl (ref 78.0–100.0)
Monocytes Absolute: 0.6 10*3/uL (ref 0.1–1.0)
Neutro Abs: 3.6 10*3/uL (ref 1.4–7.7)
Neutrophils Relative %: 42.7 % — ABNORMAL LOW (ref 43.0–77.0)
RBC: 6.04 Mil/uL — ABNORMAL HIGH (ref 4.22–5.81)

## 2012-12-10 LAB — COMPREHENSIVE METABOLIC PANEL
AST: 33 U/L (ref 0–37)
Albumin: 3.8 g/dL (ref 3.5–5.2)
Alkaline Phosphatase: 77 U/L (ref 39–117)
BUN: 10 mg/dL (ref 6–23)
Potassium: 3.8 mEq/L (ref 3.5–5.1)
Sodium: 137 mEq/L (ref 135–145)

## 2012-12-10 MED ORDER — ONDANSETRON HCL 8 MG PO TABS: 8.0000 mg | ORAL_TABLET | Freq: Three times a day (TID) | ORAL | Status: DC | PRN

## 2012-12-10 NOTE — Patient Instructions (Signed)
Use zofran for nausea.  Stop at lab on way out. Go to ER if not tolerating fluids.  Follow up with PCP if not improving by early next week.

## 2012-12-10 NOTE — Assessment & Plan Note (Signed)
This is still most likely cause, but given his recent hx and persistant symptoms... eval with labs. Use zofran for pain... Push fluids. ( tachycardia appears to be his baseline)

## 2012-12-10 NOTE — Progress Notes (Signed)
  Subjective:    Patient ID: Daniel Vang, male    DOB: [birth year], 27 y.o.   MRN: [id number]  HPI  27 year old male with hx of testicular cancer presents for follow . He was seen on 10/7 for vomiting and fatigue... Dx with viral syndrome. He returns today with continued vomiting 3-4 times a day. Decreased appetite. Can keep down fluids, not solids. Extreme fatigue, body soreness and achiness. No diarrhea, no abdominal pain. No fever. No blood in emesis. No dysuria.  No sick contacts.  s/p appendectomy    Review of Systems  Constitutional: Positive for fatigue. Negative for fever.  HENT: Negative for ear pain, postnasal drip, rhinorrhea and sinus pressure.   Eyes: Negative for pain.  Respiratory: Negative for shortness of breath.   Cardiovascular: Negative for chest pain.  Gastrointestinal: Positive for nausea. Negative for abdominal pain.  Neurological: Positive for headaches.       Objective:   Physical Exam  Constitutional: Vital signs are normal. He appears well-developed and well-nourished.  HENT:  Head: Normocephalic.  Right Ear: Hearing normal.  Left Ear: Hearing normal.  Nose: Nose normal.  Mouth/Throat: Oropharynx is clear and moist and mucous membranes are normal.  Neck: Trachea normal. Carotid bruit is not present. No mass and no thyromegaly present.  Cardiovascular: Normal rate, regular rhythm and normal pulses.  Exam reveals no gallop, no distant heart sounds and no friction rub.   No murmur heard. No peripheral edema  Pulmonary/Chest: Effort normal and breath sounds normal. No respiratory distress.  Abdominal: There is tenderness in the right lower quadrant, epigastric area and left lower quadrant.  Very mild soreness in epigastrum and lower abdomen... Likely muscle soreness from emesis.  Skin: Skin is warm, dry and intact. No rash noted.  Psychiatric: He has a normal mood and affect. His speech is normal and behavior is normal. Thought content normal.           Assessment & Plan:

## 2012-12-11 LAB — HEPATITIS PANEL, ACUTE
HCV Ab: NEGATIVE
Hep A IgM: NEGATIVE
Hep B C IgM: NEGATIVE
Hepatitis B Surface Ag: NEGATIVE

## 2013-01-06 ENCOUNTER — Other Ambulatory Visit: Payer: Self-pay

## 2013-02-07 ENCOUNTER — Telehealth: Payer: Self-pay | Admitting: Family Medicine

## 2013-02-07 ENCOUNTER — Emergency Department: Payer: Self-pay

## 2013-02-07 LAB — URINALYSIS, COMPLETE
Bacteria: NONE SEEN
Ketone: NEGATIVE
Nitrite: NEGATIVE
Protein: NEGATIVE
RBC,UR: 2 /HPF (ref 0–5)
Specific Gravity: 1.026 (ref 1.003–1.030)
Squamous Epithelial: NONE SEEN

## 2013-02-07 LAB — COMPREHENSIVE METABOLIC PANEL
Albumin: 3.7 g/dL (ref 3.4–5.0)
Alkaline Phosphatase: 90 U/L
Anion Gap: 5 — ABNORMAL LOW (ref 7–16)
BUN: 11 mg/dL (ref 7–18)
Chloride: 107 mmol/L (ref 98–107)
Creatinine: 0.82 mg/dL (ref 0.60–1.30)
Glucose: 107 mg/dL — ABNORMAL HIGH (ref 65–99)
SGOT(AST): 32 U/L (ref 15–37)

## 2013-02-07 LAB — CBC
HCT: 43.4 % (ref 40.0–52.0)
HGB: 15.1 g/dL (ref 13.0–18.0)
MCHC: 34.7 g/dL (ref 32.0–36.0)
Platelet: 223 10*3/uL (ref 150–440)
RDW: 13.5 % (ref 11.5–14.5)

## 2013-02-07 NOTE — Telephone Encounter (Signed)
Patient Information:  Caller Name: [place]  Phone: [phone]  Patient: Daniel Vang, Daniel Vang  Gender: Male  DOB: [birth year]  Age: 27 Years  PCP: [name] [facility] [place])  Office Follow Up:  Does the office need to follow up with this patient?: No  Instructions For The Office: N/A  RN Note:  No radiation since 10/14.  Called office per MD orders when office is open for ED disposition. Dr Patsy Lager has no more appointments. Donna/office nurse advised to go to ED now.  Symptoms  Reason For Call & Symptoms: Emergent Call: Vomiting with specks of blood.  Vomited X 3 since 12:07. No diarrhea.  Reviewed Health History In EMR: Yes  Reviewed Medications In EMR: Yes  Reviewed Allergies In EMR: Yes  Reviewed Surgeries / Procedures: Yes  Date of Onset of Symptoms: 02/07/2013  Any Fever: Yes  Fever Taken: Tactile  Fever Time Of Reading: 12:05:00  Fever Last Reading: N/A  Guideline(s) Used:  Vomiting  Disposition Per Guideline:   Go to ED Now  Reason For Disposition Reached:   Vomiting red blood or black (coffee ground) material  Advice Given:  N/A  RN Overrode Recommendation:  Go To ED  Per office nurse/Donna

## 2013-02-07 NOTE — Telephone Encounter (Signed)
Appropriate with cancer.

## 2013-04-05 ENCOUNTER — Encounter: Payer: Self-pay | Admitting: Family Medicine

## 2013-04-05 ENCOUNTER — Ambulatory Visit (INDEPENDENT_AMBULATORY_CARE_PROVIDER_SITE_OTHER): Payer: 59 | Admitting: Family Medicine

## 2013-04-05 VITALS — BP 122/76 | HR 88 | Temp 98.0°F | Ht 68.0 in | Wt 158.2 lb

## 2013-04-05 DIAGNOSIS — R102 Pelvic and perineal pain: Secondary | ICD-10-CM

## 2013-04-05 DIAGNOSIS — R109 Unspecified abdominal pain: Secondary | ICD-10-CM

## 2013-04-05 LAB — POCT URINALYSIS DIPSTICK
BILIRUBIN UA: NEGATIVE
Glucose, UA: NEGATIVE
Ketones, UA: NEGATIVE
LEUKOCYTES UA: NEGATIVE
NITRITE UA: NEGATIVE
Spec Grav, UA: 1.025
UROBILINOGEN UA: 0.2
pH, UA: 6

## 2013-04-05 NOTE — Patient Instructions (Signed)
Good to see you. Please increase your fluid intake. Please stop by to see Rosaria Ferries on your way out to set up your CT scan. I will call you as soon as I have the results.

## 2013-04-05 NOTE — Progress Notes (Signed)
Subjective:    Patient ID: Daniel Vang, male    DOB: 1985-11-08, 28 y.o.   MRN: [id number]  HPI  28 year old male pleasant pt of Dr. Lorelei Pont with hx of testicular cancer presents with his wife for stomach cramping. Last radiation treatment in October. He is also status post right radical orchiectomy on 08/26/2012.  Of note, he was seen by Dr. Diona Browner on 10/7 for vomiting and fatigue and diagnosed with viral syndrome.  Returned 3 days later with continued vomiting 3-4 times a day. Decreased appetite. Can keep down fluids, not solids. Extreme fatigue, body soreness and achiness. No diarrhea, no abdominal pain.  Hepatitis labs neg at that time. Still felt this was viral or related to radiation tx and symptoms eventually resolved.  Two weeks ago, daily bilateral abdominal cramping- usually at night or in the morning. Twice the pain became more stabbing in nature, 10/10 that took his breath away- only lasted seconds.   s/p appendectomy No nauesa, no vomiting. No diarrhea or changes in bowel habits. No blood in urine or blood in stool.  Lab Results  Component Value Date   ALT 27 12/10/2012   AST 33 12/10/2012   ALKPHOS 77 12/10/2012   BILITOT 2.6* 12/10/2012   Lab Results  Component Value Date   WBC 8.4 12/10/2012   HGB 16.8 12/10/2012   HCT 48.6 12/10/2012   MCV 80.4 12/10/2012   PLT 164.0 12/10/2012   Lab Results  Component Value Date   HEPAIGM NEG 12/10/2012   HEPBIGM NEG 12/10/2012     CT CHEST, ABDOMEN, AND PELVIS WITH CONTRAST (09/08/2012) TECHNIQUE:  Multidetector CT imaging of the chest, abdomen and pelvis was  performed following the standard protocol during bolus  administration of intravenous contrast.  CONTRAST: 180mL OMNIPAQUE IOHEXOL 300 MG/ML SOLN  COMPARISON: 08/31/2012  FINDINGS:  CT CHEST FINDINGS  No pleural effusion identified appear there is no airspace  consolidation identified. No pulmonary nodule or mass identified.  The heart is normal in  size. No pericardial effusion. No mediastinal  or hilar adenopathy. No axillary or supraclavicular adenopathy.  Review of the visualized osseous structures is negative for  aggressive lytic or sclerotic bone lesion.  CT ABDOMEN AND PELVIS FINDINGS  There is no focal liver abnormality. The gallbladder is normal. No  biliary dilatation identified. Normal appearance of the spleen. The  adrenal glands are both normal. Normal appearance of the kidneys.  The urinary bladder appears within normal limits.  The abdominal aorta has a normal caliber. There is no  retroperitoneal adenopathy. There is no pelvic or inguinal  adenopathy. No free fluid or fluid collection within the abdomen or  pelvis.  The stomach is normal. The small bowel loops have a normal course  and caliber and there is no evidence for obstruction. Normal  appearance of the colon.  Review of the visualized osseous structures is on unremarkable.  IMPRESSION:  CT CHEST IMPRESSION  No acute findings and no evidence for metastatic disease.  CT ABDOMEN AND PELVIS IMPRESSION  No acute findings and no evidence for metastatic disease.  Review of Systems  See HPI No dysuria No fever No back pain No scrotal pain or edema    Objective:   Physical Exam  BP 122/76  Pulse 88  Temp(Src) 98 F (36.7 C) (Oral)  Ht 5\' 8"  (1.727 m)  Wt 158 lb 4 oz (71.782 kg)  BMI 24.07 kg/m2  SpO2 98%  Constitutional: Vital signs are normal. He appears well-developed and well-nourished.  HENT:  Head: Normocephalic.  Right Ear: Hearing normal.  Left Ear: Hearing normal.  Nose: Nose normal.  Mouth/Throat: Oropharynx is clear and moist and mucous membranes are normal.  Neck: Trachea normal. Carotid bruit is not present. No mass and no thyromegaly present.  Cardiovascular: Normal rate, regular rhythm and normal pulses.  Exam reveals no gallop, no distant heart sounds and no friction rub.   No murmur heard. No peripheral edema  Pulmonary/Chest:  Effort normal and breath sounds normal. No respiratory distress.  Abdominal:   Very mild soreness in bilateral suprapubic area Skin: Skin is warm, dry and intact. No rash noted.  Psychiatric: He has a normal mood and affect. His speech is normal and behavior is normal. Thought content normal.          Assessment & Plan:

## 2013-04-05 NOTE — Assessment & Plan Note (Signed)
UA positive for trace blood and protein, otherwise neg. Advised to push fluid, send urine for cx. Given h/o seminoma, will order CT abdomen/pelvis for further evaluation. The patient indicates understanding of these issues and agrees with the plan.

## 2013-04-05 NOTE — Progress Notes (Signed)
Pre-visit discussion using our clinic review tool. No additional management support is needed unless otherwise documented below in the visit note.  

## 2013-04-07 LAB — URINE CULTURE
COLONY COUNT: NO GROWTH
ORGANISM ID, BACTERIA: NO GROWTH

## 2013-04-08 ENCOUNTER — Ambulatory Visit (INDEPENDENT_AMBULATORY_CARE_PROVIDER_SITE_OTHER)
Admission: RE | Admit: 2013-04-08 | Discharge: 2013-04-08 | Disposition: A | Discharge status: Home / Self Care | Payer: 59 | Source: Ambulatory Visit | Attending: Family Medicine | Admitting: Family Medicine

## 2013-04-08 DIAGNOSIS — R109 Unspecified abdominal pain: Secondary | ICD-10-CM

## 2013-04-08 DIAGNOSIS — R102 Pelvic and perineal pain: Secondary | ICD-10-CM

## 2013-04-08 MED ORDER — IOHEXOL 300 MG/ML  SOLN
100.0000 mL | Freq: Once | INTRAMUSCULAR | Status: AC | PRN
  Administered 2013-04-08: 100 mL via INTRAVENOUS

## 2013-05-17 ENCOUNTER — Ambulatory Visit (HOSPITAL_COMMUNITY)
Admission: RE | Admit: 2013-05-17 | Discharge: 2013-05-17 | Disposition: A | Discharge status: Home / Self Care | Payer: 59 | Source: Ambulatory Visit | Attending: Oncology | Admitting: Oncology

## 2013-05-17 ENCOUNTER — Other Ambulatory Visit (HOSPITAL_BASED_OUTPATIENT_CLINIC_OR_DEPARTMENT_OTHER): Payer: 59

## 2013-05-17 DIAGNOSIS — C6290 Malignant neoplasm of unspecified testis, unspecified whether descended or undescended: Secondary | ICD-10-CM

## 2013-05-17 LAB — COMPREHENSIVE METABOLIC PANEL (CC13)
ALT: 18 U/L (ref 0–55)
AST: 17 U/L (ref 5–34)
Albumin: 4.2 g/dL (ref 3.5–5.0)
Alkaline Phosphatase: 72 U/L (ref 40–150)
Anion Gap: 10 mEq/L (ref 3–11)
BUN: 14.9 mg/dL (ref 7.0–26.0)
CALCIUM: 9.6 mg/dL (ref 8.4–10.4)
CHLORIDE: 108 meq/L (ref 98–109)
CO2: 23 mEq/L (ref 22–29)
CREATININE: 0.8 mg/dL (ref 0.7–1.3)
Glucose: 89 mg/dl (ref 70–140)
Potassium: 4 mEq/L (ref 3.5–5.1)
SODIUM: 141 meq/L (ref 136–145)
TOTAL PROTEIN: 7.5 g/dL (ref 6.4–8.3)
Total Bilirubin: 1.65 mg/dL — ABNORMAL HIGH (ref 0.20–1.20)

## 2013-05-17 LAB — CBC WITH DIFFERENTIAL/PLATELET
BASO%: 0.6 % (ref 0.0–2.0)
Basophils Absolute: 0 10*3/uL (ref 0.0–0.1)
EOS%: 2.5 % (ref 0.0–7.0)
Eosinophils Absolute: 0.2 10*3/uL (ref 0.0–0.5)
HEMATOCRIT: 44.6 % (ref 38.4–49.9)
HGB: 15.1 g/dL (ref 13.0–17.1)
LYMPH#: 1.4 10*3/uL (ref 0.9–3.3)
LYMPH%: 20.5 % (ref 14.0–49.0)
MCH: 27.6 pg (ref 27.2–33.4)
MCHC: 33.9 g/dL (ref 32.0–36.0)
MCV: 81.6 fL (ref 79.3–98.0)
MONO#: 0.4 10*3/uL (ref 0.1–0.9)
MONO%: 6.6 % (ref 0.0–14.0)
NEUT#: 4.7 10*3/uL (ref 1.5–6.5)
NEUT%: 69.8 % (ref 39.0–75.0)
Platelets: 232 10*3/uL (ref 140–400)
RBC: 5.47 10*6/uL (ref 4.20–5.82)
RDW: 13.1 % (ref 11.0–14.6)
WBC: 6.7 10*3/uL (ref 4.0–10.3)

## 2013-05-17 LAB — LACTATE DEHYDROGENASE (CC13): LDH: 151 U/L (ref 125–245)

## 2013-05-20 LAB — AFP TUMOR MARKER: AFP-Tumor Marker: <1.3 ng/mL (ref 0.0–8.0)

## 2013-05-20 LAB — BETA HCG QUANT (REF LAB): Beta hCG, Tumor Marker: <2 m[IU]/mL (ref <5.0)

## 2013-05-23 ENCOUNTER — Encounter: Payer: Self-pay | Admitting: Radiation Oncology

## 2013-05-23 ENCOUNTER — Ambulatory Visit
Admission: RE | Admit: 2013-05-23 | Discharge: 2013-05-23 | Disposition: A | Discharge status: Home / Self Care | Payer: 59 | Source: Ambulatory Visit | Attending: Radiation Oncology | Admitting: Radiation Oncology

## 2013-05-23 VITALS — BP 102/66 | HR 66 | Temp 97.5°F | Ht 68.0 in | Wt 159.7 lb

## 2013-05-23 DIAGNOSIS — C6290 Malignant neoplasm of unspecified testis, unspecified whether descended or undescended: Secondary | ICD-10-CM

## 2013-05-23 NOTE — Progress Notes (Signed)
Radiation Oncology         [phone]) [phone] ________________________________  Name: Daniel Vang MRN: [id number]  Date: 05/23/2013  DOB: 22-Feb-1986  Follow-Up Visit Note  CC: [name], MD  [name], MD  Diagnosis:   Seminoma of the right testicle (pT2, pNx)  Interval Since Last Radiation:  7  months  Narrative:  The patient returns today for routine follow-up.  Overall the patient has been doing relatively well. 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 He did however develop some problems with vomiting and abdominal pain last month. Patient did undergo CT scan of the abdomen and pelvis which showed a no suspicious changes or signs of recurrence.  Patient denies any pain in the scrotal area or difficulties with urination. Since his last followup with me has been married and is accompanied by his wife on evaluation today.  Patient did undergo blood work last week in preparation for medical oncology followup. All the patient's blood work including tumor markers looked good except for a elevated bilirubin level which is improved from his last followup appointment. A chest x-ray was also performed showing no lung metastasis.                            ALLERGIES:  is allergic to morphine.  Meds: Current Outpatient Prescriptions  Medication Sig Dispense Refill  . ibuprofen (ADVIL,MOTRIN) 200 MG tablet Take 200 mg by mouth as needed.        No current facility-administered medications for this encounter.    Physical Findings: The patient is in no acute distress. Patient is alert and oriented.  height is 5\' 8"  (1.727 m) and weight is 159 lb 11.2 oz (72.439 kg). His temperature is 97.5 F (36.4 C). His blood pressure is 102/66 and his pulse is 66. Marland Kitchen  No palpable supraclavicular or axillary adenopathy. The lungs are clear to auscultation. The heart has a regular rhythm and rate. The abdomen is soft and nontender with normal bowel sounds.  Examination of the inguinal areas reveals a scar along the right side from his orchiectomy. On the left side the patient does have some bulging in the left femoral/inguinal area concerning for possible early hernia.  This swelling disappears when the patient is lying flat. He appears to have some laxity in this area with coughing. The left testicle is soft without masses. Patient has a prosthesis in place along the right side.  Lab Findings: Lab Results  Component Value Date   WBC 6.7 05/17/2013   HGB 15.1 05/17/2013   HCT 44.6 05/17/2013   MCV 81.6 05/17/2013   PLT 232 05/17/2013      Radiographic Findings: Dg Chest 2 View  05/17/2013   CLINICAL DATA:  Testicular cancer  EXAM: CHEST  2 VIEW  COMPARISON:  11/23/2012  FINDINGS: Cardiomediastinal silhouette is unremarkable. No acute infiltrate or pleural effusion. No pulmonary edema. Bony thorax is unremarkable.  IMPRESSION: No active cardiopulmonary disease.   Electronically Signed   By: Lahoma Crocker M.D.   On: 05/17/2013 14:29    Impression:  No evidence of recurrence on clinical exam today.  Recent CT scans as above as well as chest x-ray and blood work showing no evidence of recurrence.  Plan:  Routine followup in 6 months. Patient will followup with medical oncology later this month. Patient will be referred to Dr. Armandina Gemma for evaluation of a possible hernia.  ____________________________________ Blair Promise, MD

## 2013-05-23 NOTE — Progress Notes (Signed)
Daniel Vang here for follow up after treatment to his right testicle.  He denies pain, fatigue, nausea, diarrhea and bladder changes.

## 2013-05-24 ENCOUNTER — Ambulatory Visit: Payer: 59 | Admitting: Oncology

## 2013-05-24 ENCOUNTER — Telehealth: Payer: Self-pay | Admitting: *Deleted

## 2013-05-24 NOTE — Telephone Encounter (Signed)
Called patient to inform of appt. With Dr. Harlow Asa on 06-07-13 - arrival time - 9:15 am , spoke with patient and he is aware of this appt.

## 2013-05-25 ENCOUNTER — Telehealth: Payer: Self-pay | Admitting: Oncology

## 2013-05-25 ENCOUNTER — Ambulatory Visit (HOSPITAL_BASED_OUTPATIENT_CLINIC_OR_DEPARTMENT_OTHER): Payer: 59 | Admitting: Oncology

## 2013-05-25 VITALS — BP 111/61 | HR 76 | Temp 98.0°F | Resp 19 | Ht 68.0 in | Wt 162.3 lb

## 2013-05-25 DIAGNOSIS — C6290 Malignant neoplasm of unspecified testis, unspecified whether descended or undescended: Secondary | ICD-10-CM

## 2013-05-25 NOTE — Progress Notes (Signed)
Hematology and Oncology Follow Up Visit  Daniel Vang [id number] 02-17-1986 28 y.o. 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 He had a T2 tumor with a 1.1 cm enlarged lymph node.   Prior Therapy:  He is status post right radical orchiectomy on 08/26/2012. He is S/P radiation therapy for a total of 25 gray in 20 fractions completed in 10/2012.   Current therapy: Observation and surveillance.  Interim History:  Daniel Vang presents today for a followup visit. He  presents today for clinical surveillance on followup. Since his last visit, he did not report any nausea or vomiting. Does not report any diarrhea or abdominal pain. He did have some mild fatigue but otherwise completely asymptomatic. Is not reporting any lymphadenopathy or dysphagia. Has not reported any change in his performance status or activity level. Has not reported any GI symptoms. Days to work full-time without any hindrance or decline. He also was married about 3 months ago.  Medications: I have reviewed the patient's current medications.  Current Outpatient Prescriptions  Medication Sig Dispense Refill  . ibuprofen (ADVIL,MOTRIN) 200 MG tablet Take 200 mg by mouth as needed.        No current facility-administered medications for this visit.     Allergies:  Allergies  Allergen Reactions  . Morphine Nausea And Vomiting and Other (See Comments)    DIZZY    Past Medical History, Surgical history, Social history, and Family History were reviewed and updated.  Review of Systems:  Remaining ROS negative. Physical Exam:  Blood pressure 111/61, pulse 76, temperature 98 F (36.7 C), temperature source Oral, resp. rate 19, height 5\' 8"  (1.727 m), weight 162 lb 4.8 oz (73.619 kg), SpO2 100.00%. ECOG: 0 General appearance: alert, cooperative and appears stated age Head: Normocephalic, without obvious  abnormality, atraumatic Neck: no adenopathy, no carotid bruit, no JVD, supple, symmetrical, trachea midline and thyroid not enlarged, symmetric, no tenderness/mass/nodules Lymph nodes: Cervical, supraclavicular, and axillary nodes normal. Heart:regular rate and rhythm, S1, S2 normal, no murmur, click, rub or gallop Lung:chest clear, no wheezing, rales, normal symmetric air entry Abdomin: soft, non-tender, without masses or organomegaly EXT:no erythema, induration, or nodules   Lab Results: Lab Results  Component Value Date   WBC 6.7 05/17/2013   HGB 15.1 05/17/2013   HCT 44.6 05/17/2013   MCV 81.6 05/17/2013   PLT 232 05/17/2013     Chemistry      Component Value Date/Time   NA 141 05/17/2013 1207   NA 137 12/10/2012 1002   K 4.0 05/17/2013 1207   K 3.8 12/10/2012 1002   CL 98 12/10/2012 1002   CO2 23 05/17/2013 1207   CO2 31 12/10/2012 1002   BUN 14.9 05/17/2013 1207   BUN 10 12/10/2012 1002   CREATININE 0.8 05/17/2013 1207   CREATININE 1.2 12/10/2012 1002      Component Value Date/Time   CALCIUM 9.6 05/17/2013 1207   CALCIUM 8.7 12/10/2012 1002   ALKPHOS 72 05/17/2013 1207   ALKPHOS 77 12/10/2012 1002   AST 17 05/17/2013 1207   AST 33 12/10/2012 1002   ALT 18 05/17/2013 1207   ALT 27 12/10/2012 1002   BILITOT 1.65* 05/17/2013 1207   BILITOT 2.6* 12/10/2012 1002      Results for Daniel Vang (MRN [id number]) as of 05/25/2013 14:50  Ref. Range 05/17/2013 12:07  AFP-Tumor Marker Latest Range: 0.0-8.0 ng/mL <1.3  Beta hCG, Tumor  Marker Latest Range: < 5.0 mIU/mL < 2.0     EXAM:  CHEST 2 VIEW  COMPARISON: 11/23/2012  FINDINGS:  Cardiomediastinal silhouette is unremarkable. No acute infiltrate or  pleural effusion. No pulmonary edema. Bony thorax is unremarkable.  IMPRESSION:  No active cardiopulmonary disease  Impression and Plan:  28 year old gentleman with the following issues:  1. 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  He is status post orchiectomy followed by radiation therapy. CT scan from 04/08/2013, chest x-ray from 05/17/2013, and laboratory data were discussed today. He has no clear evidence to suggest recurrent or relapsed disease and no further intervention is needed. However, he will need continuous active surveillance long term given the tendency of seminoma is to recur later in the course of this disease. I will set up a surveillance program for him which he is willing to adhere to. He will return in about 6 months for laboratory testing as well as a CT scan imaging.  2. Enlarged iliac lymph nodes: This have resolved at this point.   Daniel Button, MD 3/25/20153:32 PM

## 2013-05-25 NOTE — Telephone Encounter (Signed)
gv adn printed appt sched adn avs for pt for SEpt and OCT....gv pt barium

## 2013-06-07 ENCOUNTER — Ambulatory Visit (INDEPENDENT_AMBULATORY_CARE_PROVIDER_SITE_OTHER): Payer: 59 | Admitting: Surgery

## 2013-06-09 ENCOUNTER — Telehealth: Payer: Self-pay | Admitting: *Deleted

## 2013-06-09 NOTE — Telephone Encounter (Signed)
CALLED PATIENT TO ASK QUESTION, LVM FOR A RETURN CALL 

## 2013-11-10 ENCOUNTER — Ambulatory Visit: Admission: RE | Admit: 2013-11-10 | Payer: 59 | Source: Ambulatory Visit | Admitting: Radiation Oncology

## 2013-11-10 ENCOUNTER — Telehealth: Payer: Self-pay | Admitting: Oncology

## 2013-11-10 NOTE — Telephone Encounter (Signed)
Called and left a message for [facility] regarding his appointment today with Dr. Sondra Come.  Requested a call back.

## 2013-11-24 ENCOUNTER — Telehealth: Payer: Self-pay | Admitting: Medical Oncology

## 2013-11-24 NOTE — Telephone Encounter (Signed)
Call to patient informing him of r/s CT to 10/7 d/t insurance denial. Patient states he received a call informing him of new appt date. Informed patient will cancel his morning lab appt and r/s to 10/07 prior to CT scan as well as r/s appt with Dr. Alen Blew to be scheduled for after CT scan. Patient gave verbal understanding, knows to expect call from schedulers regarding appt changes.   POF sent.  Mssg routed to MD.    Labs 09/25 cancelled MD appt 10/2 cancelled

## 2013-11-25 ENCOUNTER — Other Ambulatory Visit: Payer: 59

## 2013-11-25 ENCOUNTER — Telehealth: Payer: Self-pay | Admitting: Oncology

## 2013-11-25 ENCOUNTER — Ambulatory Visit (HOSPITAL_COMMUNITY): Payer: Managed Care, Other (non HMO)

## 2013-11-25 NOTE — Telephone Encounter (Signed)
, °

## 2013-11-28 ENCOUNTER — Telehealth: Payer: Self-pay | Admitting: Oncology

## 2013-11-28 NOTE — Telephone Encounter (Signed)
Pt called lft msg about upcoming apt's, called pt back lft msg for pt to return our call if he is wanting to r/s.... KJ

## 2013-11-30 ENCOUNTER — Ambulatory Visit: Payer: Managed Care, Other (non HMO) | Admitting: Radiation Oncology

## 2013-12-02 ENCOUNTER — Ambulatory Visit: Payer: 59 | Admitting: Oncology

## 2013-12-07 ENCOUNTER — Ambulatory Visit (HOSPITAL_COMMUNITY): Payer: Managed Care, Other (non HMO)

## 2013-12-07 ENCOUNTER — Other Ambulatory Visit: Payer: Managed Care, Other (non HMO)

## 2013-12-09 ENCOUNTER — Ambulatory Visit: Payer: Managed Care, Other (non HMO) | Admitting: Oncology

## 2013-12-16 ENCOUNTER — Ambulatory Visit: Payer: Managed Care, Other (non HMO) | Admitting: Oncology

## 2013-12-16 ENCOUNTER — Other Ambulatory Visit: Payer: Self-pay

## 2013-12-16 ENCOUNTER — Other Ambulatory Visit: Payer: Self-pay | Admitting: Oncology

## 2014-01-13 ENCOUNTER — Ambulatory Visit: Payer: Managed Care, Other (non HMO) | Admitting: Oncology

## 2014-01-13 ENCOUNTER — Other Ambulatory Visit: Payer: Self-pay | Admitting: Oncology

## 2014-01-13 ENCOUNTER — Other Ambulatory Visit: Payer: Managed Care, Other (non HMO)

## 2014-01-17 ENCOUNTER — Other Ambulatory Visit: Payer: Self-pay | Admitting: Oncology

## 2014-01-17 DIAGNOSIS — C6290 Malignant neoplasm of unspecified testis, unspecified whether descended or undescended: Secondary | ICD-10-CM

## 2014-01-18 ENCOUNTER — Telehealth: Payer: Self-pay | Admitting: Oncology

## 2014-01-18 NOTE — Telephone Encounter (Signed)
lvm for pt regarding to [place] appts....mailed pt appt sched and letter for pt for DEc

## 2014-02-01 ENCOUNTER — Other Ambulatory Visit: Payer: Managed Care, Other (non HMO)

## 2014-02-08 ENCOUNTER — Ambulatory Visit: Payer: Managed Care, Other (non HMO) | Admitting: Physician Assistant

## 2014-03-21 ENCOUNTER — Encounter: Payer: Self-pay | Admitting: Family Medicine

## 2014-03-21 ENCOUNTER — Ambulatory Visit (INDEPENDENT_AMBULATORY_CARE_PROVIDER_SITE_OTHER): Payer: 59 | Admitting: Family Medicine

## 2014-03-21 VITALS — BP 108/74 | HR 95 | Temp 98.1°F | Ht 68.0 in | Wt 164.0 lb

## 2014-03-21 DIAGNOSIS — J029 Acute pharyngitis, unspecified: Secondary | ICD-10-CM

## 2014-03-21 DIAGNOSIS — J020 Streptococcal pharyngitis: Secondary | ICD-10-CM | POA: Insufficient documentation

## 2014-03-21 LAB — POCT RAPID STREP A (OFFICE): RAPID STREP A SCREEN: POSITIVE — AB

## 2014-03-21 MED ORDER — AMOXICILLIN 500 MG PO CAPS: 1000.0000 mg | ORAL_CAPSULE | Freq: Two times a day (BID) | ORAL | Status: DC

## 2014-03-21 MED ORDER — AMOXICILLIN 500 MG PO CAPS
1000.0000 mg | ORAL_CAPSULE | Freq: Two times a day (BID) | ORAL | Status: DC
Start: 2014-03-21 — End: 2014-08-09

## 2014-03-21 NOTE — Addendum Note (Signed)
Addended by: Carter Kitten on: 03/21/2014 03:29 PM   Modules accepted: Orders

## 2014-03-21 NOTE — Assessment & Plan Note (Signed)
Treat with antibiotics, pain control.

## 2014-03-21 NOTE — Progress Notes (Signed)
   Subjective:    Patient ID: Daniel Vang, male    DOB: 1985-03-05, 29 y.o.   MRN: [id number]  URI  This is a new problem. The current episode started in the past 7 days. The problem has been rapidly worsening. Maximum temperature: subjective fever. The fever has been present for 1 to 2 days. Associated symptoms include congestion, coughing, ear pain, rhinorrhea and a sore throat. Pertinent negatives include no chest pain, headaches, joint pain, plugged ear sensation, sinus pain, sneezing or wheezing. Associated symptoms comments: Left ear pain, bilateral sore throat. He has tried NSAIDs (nyquil) for the symptoms. The treatment provided mild relief.    No sick contacts  HX of former limited smoker No history of asthma.  Review of Systems  HENT: Positive for congestion, ear pain, rhinorrhea and sore throat. Negative for sneezing.   Respiratory: Positive for cough. Negative for wheezing.   Cardiovascular: Negative for chest pain.  Musculoskeletal: Negative for joint pain.  Neurological: Negative for headaches.       Objective:   Physical Exam  Constitutional: Vital signs are normal. He appears well-developed and well-nourished.  Non-toxic appearance. He does not appear ill. No distress.  HENT:  Head: Normocephalic and atraumatic.  Right Ear: Hearing, tympanic membrane, external ear and ear canal normal. No tenderness. No foreign bodies. Tympanic membrane is not retracted and not bulging.  Left Ear: Hearing, tympanic membrane and ear canal normal. No tenderness. No foreign bodies. Tympanic membrane is not retracted and not bulging.  Nose: Nose normal. No mucosal edema or rhinorrhea. Right sinus exhibits no maxillary sinus tenderness and no frontal sinus tenderness. Left sinus exhibits no maxillary sinus tenderness and no frontal sinus tenderness.  Mouth/Throat: Uvula is midline and mucous membranes are normal. Normal dentition. No uvula swelling or dental caries. Posterior oropharyngeal  edema and posterior oropharyngeal erythema present. No oropharyngeal exudate or tonsillar abscesses.  Left external ear with swelling and redness,  Eyes: Conjunctivae, EOM and lids are normal. Pupils are equal, round, and reactive to light. Lids are everted and swept, no foreign bodies found.  Neck: Trachea normal, normal range of motion and phonation normal. Neck supple. Carotid bruit is not present. No thyroid mass and no thyromegaly present.  Cardiovascular: Normal rate, regular rhythm, S1 normal, S2 normal, normal heart sounds, intact distal pulses and normal pulses.  Exam reveals no gallop.   No murmur heard. Pulmonary/Chest: Effort normal and breath sounds normal. No respiratory distress. He has no wheezes. He has no rhonchi. He has no rales.  Abdominal: Soft. Normal appearance and bowel sounds are normal. There is no hepatosplenomegaly. There is no tenderness. There is no rebound, no guarding and no CVA tenderness. No hernia.  Neurological: He is alert. He has normal reflexes.  Skin: Skin is warm, dry and intact. No rash noted.  Psychiatric: He has a normal mood and affect. His speech is normal and behavior is normal. Judgment normal.          Assessment & Plan:

## 2014-03-21 NOTE — Patient Instructions (Signed)
Push fluids, ibuprofen 800 mg every eight hours for pain and fever. Complete amoxicillin x 10 days.   Strep Throat Strep throat is an infection of the throat caused by a bacteria named Streptococcus pyogenes. Your health care provider may call the infection streptococcal "tonsillitis" or "pharyngitis" depending on whether there are signs of inflammation in the tonsils or back of the throat. Strep throat is most common in children aged 28-15 years during the cold months of the year, but it can occur in people of any age during any season. This infection is spread from person to person (contagious) through coughing, sneezing, or other close contact. SIGNS AND SYMPTOMS   Fever or chills.  Painful, swollen, red tonsils or throat.  Pain or difficulty when swallowing.  White or yellow spots on the tonsils or throat.  Swollen, tender lymph nodes or "glands" of the neck or under the jaw.  Red rash all over the body (rare). DIAGNOSIS  Many different infections can cause the same symptoms. A test must be done to confirm the diagnosis so the right treatment can be given. A "rapid strep test" can help your health care provider make the diagnosis in a few minutes. If this test is not available, a light swab of the infected area can be used for a throat culture test. If a throat culture test is done, results are usually available in a day or two. TREATMENT  Strep throat is treated with antibiotic medicine. HOME CARE INSTRUCTIONS   Gargle with 1 tsp of salt in 1 cup of warm water, 3-4 times per day or as needed for comfort.  Family members who also have a sore throat or fever should be tested for strep throat and treated with antibiotics if they have the strep infection.  Make sure everyone in your household washes their hands well.  Do not share food, drinking cups, or personal items that could cause the infection to spread to others.  You may need to eat a soft food diet until your sore throat gets  better.  Drink enough water and fluids to keep your urine clear or pale yellow. This will help prevent dehydration.  Get plenty of rest.  Stay home from school, day care, or work until you have been on antibiotics for 24 hours.  Take medicines only as directed by your health care provider.  Take your antibiotic medicine as directed by your health care provider. Finish it even if you start to feel better. SEEK MEDICAL CARE IF:   The glands in your neck continue to enlarge.  You develop a rash, cough, or earache.  You cough up green, yellow-brown, or bloody sputum.  You have pain or discomfort not controlled by medicines.  Your problems seem to be getting worse rather than better.  You have a fever. SEEK IMMEDIATE MEDICAL CARE IF:   You develop any new symptoms such as vomiting, severe headache, stiff or painful neck, chest pain, shortness of breath, or trouble swallowing.  You develop severe throat pain, drooling, or changes in your voice.  You develop swelling of the neck, or the skin on the neck becomes red and tender.  You develop signs of dehydration, such as fatigue, dry mouth, and decreased urination.  You become increasingly sleepy, or you cannot wake up completely. MAKE SURE YOU:  Understand these instructions.  Will watch your condition.  Will get help right away if you are not doing well or get worse. Document Released: 02/15/2000 Document Revised: 07/04/2013 Document Reviewed:  04/18/2010 ExitCare Patient Information 2015 [place], [place]. This information is not intended to replace advice given to you by your health care provider. Make sure you discuss any questions you have with your health care provider.

## 2014-03-21 NOTE — Progress Notes (Signed)
Pre visit review using our clinic review tool, if applicable. No additional management support is needed unless otherwise documented below in the visit note. 

## 2014-07-25 ENCOUNTER — Other Ambulatory Visit: Payer: Self-pay | Admitting: Oncology

## 2014-07-26 ENCOUNTER — Other Ambulatory Visit: Payer: 59

## 2014-07-26 ENCOUNTER — Ambulatory Visit
Admission: RE | Admit: 2014-07-26 | Discharge: 2014-07-26 | Disposition: A | Discharge status: Home / Self Care | Payer: Managed Care, Other (non HMO) | Source: Ambulatory Visit | Attending: Oncology | Admitting: Oncology

## 2014-07-26 DIAGNOSIS — C6290 Malignant neoplasm of unspecified testis, unspecified whether descended or undescended: Secondary | ICD-10-CM

## 2014-07-26 MED ORDER — IOHEXOL 300 MG/ML  SOLN
100.0000 mL | Freq: Once | INTRAMUSCULAR | Status: AC | PRN
  Administered 2014-07-26: 100 mL via INTRAVENOUS

## 2014-08-07 ENCOUNTER — Encounter: Payer: Managed Care, Other (non HMO) | Admitting: Family Medicine

## 2014-08-07 ENCOUNTER — Telehealth: Payer: Self-pay | Admitting: Family Medicine

## 2014-08-07 DIAGNOSIS — Z0289 Encounter for other administrative examinations: Secondary | ICD-10-CM

## 2014-08-07 NOTE — Telephone Encounter (Signed)
Patient did not come in for their appointment today for right foot.  Please let me know if patient needs to be contacted immediately for follow up or no follow up needed.

## 2014-08-07 NOTE — Telephone Encounter (Signed)
He can decide if he would like to follow-up

## 2014-08-09 ENCOUNTER — Ambulatory Visit (INDEPENDENT_AMBULATORY_CARE_PROVIDER_SITE_OTHER): Payer: Managed Care, Other (non HMO) | Admitting: Family Medicine

## 2014-08-09 ENCOUNTER — Encounter: Payer: Self-pay | Admitting: Family Medicine

## 2014-08-09 VITALS — BP 108/78 | HR 86 | Temp 98.4°F | Ht 68.0 in | Wt 169.2 lb

## 2014-08-09 DIAGNOSIS — M1000 Idiopathic gout, unspecified site: Secondary | ICD-10-CM

## 2014-08-09 DIAGNOSIS — M109 Gout, unspecified: Secondary | ICD-10-CM

## 2014-08-09 HISTORY — DX: Gout, unspecified: M10.9

## 2014-08-09 MED ORDER — INDOMETHACIN 50 MG PO CAPS: 50.0000 mg | ORAL_CAPSULE | Freq: Three times a day (TID) | ORAL | Status: DC

## 2014-08-09 MED ORDER — COLCHICINE 0.6 MG PO TABS: 0.6000 mg | ORAL_TABLET | Freq: Two times a day (BID) | ORAL | Status: DC

## 2014-08-09 MED ORDER — COLCRYS 0.6 MG PO TABS: 0.6000 mg | ORAL_TABLET | Freq: Every day | ORAL | Status: DC

## 2014-08-09 NOTE — Patient Instructions (Signed)
Low-Purine Diet  Purines are compounds that affect the level of uric acid in your body. A low-purine diet is a diet that is low in purines. Eating a low-purine diet can prevent the level of uric acid in your body from getting too high and causing gout or kidney stones or both.  WHAT DO I NEED TO KNOW ABOUT THIS DIET?  · Choose low-purine foods. Examples of low-purine foods are listed in the next section.  · Drink plenty of fluids, especially water. Fluids can help remove uric acid from your body. Try to drink 8-16 cups (1.9-3.8 L) a day.  · Limit foods high in fat, especially saturated fat, as fat makes it harder for the body to get rid of uric acid. Foods high in saturated fat include pizza, cheese, ice cream, whole milk, fried foods, and gravies. Choose foods that are lower in fat and lean sources of protein. Use olive oil when cooking as it contains healthy fats that are not high in saturated fat.  · Limit alcohol. Alcohol interferes with the elimination of uric acid from your body. If you are having a gout attack, avoid all alcohol.  · Keep in mind that different people's bodies react differently to different foods. You will probably learn over time which foods do or do not affect you. If you discover that a food tends to cause your gout to flare up, avoid eating that food. You can more freely enjoy foods that do not cause problems. If you have any questions about a food item, talk to your dietitian or health care provider.  WHICH FOODS ARE LOW, MODERATE, AND HIGH IN PURINES?  The following is a list of foods that are low, moderate, and high in purines. You can eat any amount of the foods that are low in purines. You may be able to have small amounts of foods that are moderate in purines. Ask your health care provider how much of a food moderate in purines you can have. Avoid foods high in purines.  Grains  · Foods low in purines: Enriched white bread, pasta, rice, cake, cornbread, popcorn.  · Foods moderate in  purines: Whole-grain breads and cereals, wheat germ, bran, oatmeal. Uncooked oatmeal. Dry wheat bran or wheat germ.  · Foods high in purines: Pancakes, French toast, biscuits, muffins.  Vegetables  · Foods low in purines: All vegetables, except those that are moderate in purines.  · Foods moderate in purines: Asparagus, cauliflower, spinach, mushrooms, green peas.  Fruits  · All fruits are low in purines.  Meats and other Protein Foods  · Foods low in purines: Eggs, nuts, peanut butter.  · Foods moderate in purines: 80-90% lean beef, lamb, veal, pork, poultry, fish, eggs, peanut butter, nuts. Crab, lobster, oysters, and shrimp. Cooked dried beans, peas, and lentils.  · Foods high in purines: Anchovies, sardines, herring, mussels, tuna, codfish, scallops, trout, and haddock. Bacon. Organ meats (such as liver or kidney). Tripe. Game meat. Goose. Sweetbreads.  Dairy  · All dairy foods are low in purines. Low-fat and fat-free dairy products are best because they are low in saturated fat.  Beverages  · Drinks low in purines: Water, carbonated beverages, tea, coffee, cocoa.  · Drinks moderate in purines: Soft drinks and other drinks sweetened with high-fructose corn syrup. Juices. To find whether a food or drink is sweetened with high-fructose corn syrup, look at the ingredients list.  · Drinks high in purines: Alcoholic beverages (such as beer).  Condiments  · Foods   low in purines: Salt, herbs, olives, pickles, relishes, vinegar.  · Foods moderate in purines: Butter, margarine, oils, mayonnaise.  Fats and Oils  · Foods low in purines: All types, except gravies and sauces made with meat.  · Foods high in purines: Gravies and sauces made with meat.  Other Foods  · Foods low in purines: Sugars, sweets, gelatin. Cake. Soups made without meat.  · Foods moderate in purines: Meat-based or fish-based soups, broths, or bouillons. Foods and drinks sweetened with high-fructose corn syrup.  · Foods high in purines: High-fat desserts  (such as ice cream, cookies, cakes, pies, doughnuts, and chocolate).  Contact your dietitian for more information on foods that are not listed here.  Document Released: 06/14/2010 Document Revised: 02/22/2013 Document Reviewed: 01/24/2013  Exit[employer]® Patient Information ©2015 [employer]. This information is not intended to replace advice given to you by your health care provider. Make sure you discuss any questions you have with your health care provider.

## 2014-08-09 NOTE — Progress Notes (Signed)
Dr. Frederico Hamman T. Quita Mcgrory, MD, [facility] and Sports Medicine [place], [address] Phone: [phone] Fax: [phone]  08/09/2014  Patient: Daniel Vang, MRN: [id number], DOB: Jun 07, 1985, 29 y.o.  Primary Physician:  [name], MD  Chief Complaint: Foot Pain  Subjective:   Daniel Vang is a 29 y.o. very pleasant male patient who presents with the following:  R 1st MTP: New onset gout. He has been having pain for about 2 weeks, and nothing that he has tried at home is made it better.  He does not have any known history of gout.  His father does has a history of gout.  He has tried some ibuprofen without much relief.  He works on his feet [employer] basically every day.  No trauma or accident at all.  It was a little bit red, and has been swollen throughout this time.  Past Medical History, Surgical History, Social History, Family History, Problem List, Medications, and Allergies have been reviewed and updated if relevant.  Patient Active Problem List   Diagnosis Date Noted  . Gout 08/09/2014  . Testicular cancer   . Seminoma of descended right testis   . [place] SYNDROME 08/31/2007  . OPEN WOUND FINGER WITHOUT MENTION COMPLICATION 02/63/7858  . ALLERGIC RHINITIS 04/20/2007  . SCOLIOSIS, MILD 04/20/2007    Past Medical History  Diagnosis Date  . 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 Scrotal mass     right  . History of thrombophlebitis     2004--  UPPER EXTREMITIY -- RESOLVED  . Cancer 08/2012    seminoma right testis  . Seminoma of descended right testis 08/26/12  . History of radiation therapy 09/22/2012-10/19/2012    25 Gy to periaortic and right hemipelvis  . Gout 08/09/2014    Past Surgical History  Procedure Laterality Date  . Maxillary le forte osteotomy and mandibular bilateral sagittal split ramus osteotomiies with rigid internal fixation  02-20-2004    SKELETAL DEFECT  . Adenoidectomy  2001    . Orchiectomy Right 08/26/2012    Procedure: RIGHT INGUINAL ORCHIECTOMY WITH TESTICULAR PROTHESIS;  Surgeon: Malka So, MD;  Location: [facility];  Service: Urology;  Laterality: Right;  . Appendectomy  AGE 68  1998    History   Social History  . Marital Status: Married    Spouse Name: N/A  . Number of Children: N/A  . Years of Education: N/A   Occupational History  . Not on file.   Social History Main Topics  . Smoking status: Former Smoker -- 0.25 packs/day for 1 years    Quit date: 06/09/2005  . Smokeless tobacco: Never Used  . Alcohol Use: Yes     Comment: RARE  . Drug Use: No  . Sexual Activity: Not on file   Other Topics Concern  . Not on file   Social History Narrative    Family History  Problem Relation Age of Onset  . Diabetes Maternal Grandmother   . Diabetes Maternal Grandfather   . Diabetes Paternal Grandmother   . Heart attack Paternal Grandfather   . Hypertension Paternal Grandfather   . Diabetes Paternal Grandfather     Allergies  Allergen Reactions  . Morphine Nausea And Vomiting and Other (See Comments)    DIZZY    Medication list reviewed and updated in full in [place].   GEN: No fevers, chills. Nontoxic. Primarily  MSK c/o today. MSK: Detailed in the HPI GI: tolerating PO intake without difficulty Neuro: No numbness, parasthesias, or tingling associated. Otherwise the pertinent positives of the ROS are noted above.   Objective:   BP 108/78 mmHg  Pulse 86  Temp(Src) 98.4 F (36.9 C) (Oral)  Ht 5\' 8"  (1.727 m)  Wt 169 lb 4 oz (76.771 kg)  BMI 25.74 kg/m2   GEN: WDWN, NAD, Non-toxic, Alert & Oriented x 3 HEENT: Atraumatic, Normocephalic.  Ears and Nose: No external deformity. EXTR: No clubbing/cyanosis/edema NEURO: Normal gait.  PSYCH: Normally interactive. Conversant. Not depressed or anxious appearing.  Calm demeanor.   FEET: R Echymosis: no Edema: no ROM: full LE B Gait: heel toe,  non-antalgic MT pain: 1st MTP Boggy and TTP.  Callus pattern: none Lateral Mall: NT Medial Mall: NT Talus: NT Navicular: NT Cuboid: NT Calcaneous: NT Metatarsals: NT 5th MT: NT Phalanges: NT Achilles: NT Plantar Fascia: NT Fat Pad: NT Peroneals: NT Post Tib: NT Ant Drawer: neg ATFL: NT CFL: NT Deltoid: NT Other foot breakdown: none Long arch: preserved Transverse arch: preserved Hindfoot breakdown: none Sensation: intact   Radiology:  Assessment and Plan:   Acute idiopathic gout, unspecified site  Clinically consistent in classic gout.  Boggy joint, warmth, and prior redness.  All in the first MTP.  Treat classically, and if the patient has symptoms that persist he is going to call me on Friday morning.  At that point I would likely add some oral prednisone.  Refer to the patient instructions sections for details of plan shared with patient.   Patient Instructions  Low-Purine Diet Purines are compounds that affect the level of uric acid in your body. A low-purine diet is a diet that is low in purines. Eating a low-purine diet can prevent the level of uric acid in your body from getting too high and causing gout or kidney stones or both. WHAT DO I NEED TO KNOW ABOUT THIS DIET?  Choose low-purine foods. Examples of low-purine foods are listed in the next section.  Drink plenty of fluids, especially water. Fluids can help remove uric acid from your body. Try to drink 8-16 cups (1.9-3.8 L) a day.  Limit foods high in fat, especially saturated fat, as fat makes it harder for the body to get rid of uric acid. Foods high in saturated fat include pizza, cheese, ice cream, whole milk, fried foods, and gravies. Choose foods that are lower in fat and lean sources of protein. Use olive oil when cooking as it contains healthy fats that are not high in saturated fat.  Limit alcohol. Alcohol interferes with the elimination of uric acid from your body. If you are having a gout attack,  avoid all alcohol.  Keep in mind that different people's bodies react differently to different foods. You will probably learn over time which foods do or do not affect you. If you discover that a food tends to cause your gout to flare up, avoid eating that food. You can more freely enjoy foods that do not cause problems. If you have any questions about a food item, talk to your dietitian or health care provider. WHICH FOODS ARE LOW, MODERATE, AND HIGH IN PURINES? The following is a list of foods that are low, moderate, and high in purines. You can eat any amount of the foods that are low in purines. You may be able to have small amounts of foods that are moderate in purines. Ask your health care provider how  much of a food moderate in purines you can have. Avoid foods high in purines. Grains  Foods low in purines: Enriched white bread, pasta, rice, cake, cornbread, popcorn.  Foods moderate in purines: Whole-grain breads and cereals, wheat germ, bran, oatmeal. Uncooked oatmeal. Dry wheat bran or wheat germ.  Foods high in purines: Pancakes, [place] toast, biscuits, muffins. Vegetables  Foods low in purines: All vegetables, except those that are moderate in purines.  Foods moderate in purines: Asparagus, cauliflower, spinach, mushrooms, green peas. Fruits  All fruits are low in purines. Meats and other Protein Foods  Foods low in purines: Eggs, nuts, peanut butter.  Foods moderate in purines: 80-90% lean beef, lamb, veal, pork, poultry, fish, eggs, peanut butter, nuts. Crab, lobster, oysters, and shrimp. Cooked dried beans, peas, and lentils.  Foods high in purines: Anchovies, sardines, herring, mussels, tuna, codfish, scallops, trout, and haddock. Berniece Salines. Organ meats (such as liver or kidney). Tripe. Game meat. Goose. Sweetbreads. Dairy  All dairy foods are low in purines. Low-fat and fat-free dairy products are best because they are low in saturated fat. Beverages  Drinks low in  purines: Water, carbonated beverages, tea, coffee, cocoa.  Drinks moderate in purines: Soft drinks and other drinks sweetened with high-fructose corn syrup. Juices. To find whether a food or drink is sweetened with high-fructose corn syrup, look at the ingredients list.  Drinks high in purines: Alcoholic beverages (such as beer). Condiments  Foods low in purines: Salt, herbs, olives, pickles, relishes, vinegar.  Foods moderate in purines: Butter, margarine, oils, mayonnaise. Fats and Oils  Foods low in purines: All types, except gravies and sauces made with meat.  Foods high in purines: Gravies and sauces made with meat. Other Foods  Foods low in purines: Sugars, sweets, gelatin. Cake. Soups made without meat.  Foods moderate in purines: Meat-based or fish-based soups, broths, or bouillons. Foods and drinks sweetened with high-fructose corn syrup.  Foods high in purines: High-fat desserts (such as ice cream, cookies, cakes, pies, doughnuts, and chocolate). Contact your dietitian for more information on foods that are not listed here. Document Released: 06/14/2010 Document Revised: 02/22/2013 Document Reviewed: 01/24/2013 [facility] Patient Information 2015 [place], [place]. This information is not intended to replace advice given to you by your health care provider. Make sure you discuss any questions you have with your health care provider.      Follow-up: prn  New Prescriptions   COLCRYS 0.6 MG TABLET    Take 1 tablet (0.6 mg total) by mouth daily.   INDOMETHACIN (INDOCIN) 50 MG CAPSULE    Take 1 capsule (50 mg total) by mouth 3 (three) times daily with meals.   No orders of the defined types were placed in this encounter.    Signed,  Maud Deed. Jozlyn Schatz, MD   Patient's Medications  New Prescriptions   COLCRYS 0.6 MG TABLET    Take 1 tablet (0.6 mg total) by mouth daily.   INDOMETHACIN (INDOCIN) 50 MG CAPSULE    Take 1 capsule (50 mg total) by mouth 3 (three) times daily  with meals.  Previous Medications   No medications on file  Modified Medications   No medications on file  Discontinued Medications   AMOXICILLIN (AMOXIL) 500 MG CAPSULE    Take 2 capsules (1,000 mg total) by mouth 2 (two) times daily.   COLCHICINE 0.6 MG TABLET    Take 1 tablet (0.6 mg total) by mouth 2 (two) times daily.   IBUPROFEN (ADVIL,MOTRIN) 200 MG TABLET  Take 200 mg by mouth as needed.

## 2014-08-09 NOTE — Progress Notes (Signed)
Pre visit review using our clinic review tool, if applicable. No additional management support is needed unless otherwise documented below in the visit note. 

## 2014-08-10 NOTE — Progress Notes (Signed)
This encounter was created in error - please disregard.

## 2014-08-14 ENCOUNTER — Ambulatory Visit: Payer: Managed Care, Other (non HMO) | Admitting: Family Medicine

## 2015-07-25 ENCOUNTER — Encounter: Payer: Self-pay | Admitting: Family Medicine

## 2015-07-25 ENCOUNTER — Ambulatory Visit (INDEPENDENT_AMBULATORY_CARE_PROVIDER_SITE_OTHER): Payer: Self-pay | Admitting: Family Medicine

## 2015-07-25 VITALS — BP 92/68 | HR 80 | Temp 98.3°F | Ht 68.0 in | Wt 169.2 lb

## 2015-07-25 DIAGNOSIS — M25462 Effusion, left knee: Secondary | ICD-10-CM

## 2015-07-25 DIAGNOSIS — M352 Behcet's disease: Secondary | ICD-10-CM

## 2015-07-25 MED ORDER — METHYLPREDNISOLONE ACETATE 40 MG/ML IJ SUSP
80.0000 mg | Freq: Once | INTRAMUSCULAR | Status: AC
  Administered 2015-07-25: 80 mg via INTRA_ARTICULAR

## 2015-07-25 NOTE — Progress Notes (Signed)
Dr. Frederico Hamman T. Valta Dillon, MD, [facility] Primary [facility] and Sports Medicine [place], [address] Phone: [phone] Fax: [phone]  07/25/2015  Patient: Daniel Vang, MRN: [id number], DOB: Jun 14, 1985, 30 y.o.  Primary Physician:  [name], MD   Chief Complaint  Patient presents with  . Knee Pain    Left-Fluid on Knee   Subjective:   Daniel Vang is a 30 y.o. 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 The patient's father and grandmother both have a history of gout.  Historically did aspirate one of his knees in 2011, and at that point he did not have any crystals.  He did recently try some ibuprofen, which helped with the pain, but not with the swelling.  Past Medical History, Surgical History, Social History, Family History, Problem List, Medications, and Allergies have been reviewed and updated if relevant.  Patient Active Problem List   Diagnosis Date Noted  . Gout 08/09/2014  . Testicular cancer ([place])   . 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 [place] SYNDROME 08/31/2007  . OPEN WOUND FINGER WITHOUT MENTION COMPLICATION [id number]  . ALLERGIC RHINITIS 04/20/2007  . SCOLIOSIS, MILD 04/20/2007    Past Medical History  Diagnosis Date  . 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 History of thrombophlebitis     2004--  UPPER EXTREMITIY -- RESOLVED  . Cancer 08/2012    seminoma right testis  . Seminoma of descended right testis 08/26/12  . History of radiation therapy 09/22/2012-10/19/2012    25 Gy to periaortic and right hemipelvis  . Gout 08/09/2014    Past Surgical History  Procedure Laterality Date  . Maxillary le forte osteotomy and mandibular bilateral sagittal split ramus osteotomiies with rigid internal fixation   02-20-2004    SKELETAL DEFECT  . Adenoidectomy  2001  . Orchiectomy Right 08/26/2012    Procedure: RIGHT INGUINAL ORCHIECTOMY WITH TESTICULAR PROTHESIS;  Surgeon: Malka So, MD;  Location: [facility];  Service: Urology;  Laterality: Right;  . Appendectomy  AGE 31  1998    Social History   Social History  . Marital Status: Married    Spouse Name: N/A  . Number of Children: N/A  . Years of Education: N/A   Occupational History  . Not on file.   Social History Main Topics  . Smoking status: Former Smoker -- 0.25 packs/day for 1 years    Quit date: 06/09/2005  . Smokeless tobacco: Never Used  . Alcohol Use: Yes     Comment: RARE  . Drug Use: No  . Sexual Activity: Not on file   Other Topics Concern  . Not on file   Social History Narrative    Family History  Problem Relation Age of Onset  . Diabetes Maternal Grandmother   . Diabetes Maternal Grandfather   . Diabetes Paternal Grandmother   . Heart attack Paternal Grandfather   . Hypertension Paternal Grandfather   . Diabetes Paternal Grandfather     Allergies  Allergen Reactions  . Morphine Nausea And Vomiting and Other (See Comments)    DIZZY    Medication list reviewed and updated in full in [place].  GEN: No fevers, chills. Nontoxic. Primarily MSK c/o  today. MSK: Detailed in the HPI GI: tolerating PO intake without difficulty Neuro: No numbness, parasthesias, or tingling associated. Otherwise the pertinent positives of the ROS are noted above.   Objective:   BP 92/68 mmHg  Pulse 80  Temp(Src) 98.3 F (36.8 C) (Oral)  Ht 5\' 8"  (1.727 m)  Wt 169 lb 4 oz (76.771 kg)  BMI 25.74 kg/m2   GEN: WDWN, NAD, Non-toxic, Alert & Oriented x 3 HEENT: Atraumatic, Normocephalic.  Ears and Nose: No external deformity. EXTR: No clubbing/cyanosis/edema NEURO: Normal gait.  PSYCH: Normally interactive. Conversant. Not depressed or anxious appearing.  Calm demeanor.   Knee:  L Gait:  Normal heel toe pattern ROM: 0-120 Effusion: large Echymosis or edema: none Patellar tendon NT Painful PLICA: neg Patellar grind: negative Medial and lateral patellar facet loading: negative medial and lateral joint lines:NT Mcmurray's neg Flexion-pinch neg Varus and valgus stress: stable Lachman: neg Ant and Post drawer: neg Hip abduction, IR, ER: WNL Hip flexion str: 5/5 Hip abd: 5/5 Quad: 5/5 VMO atrophy:No Hamstring concentric and eccentric: 5/5   Radiology: No results found.  Assessment and Plan:   Knee effusion, left - Plan: methylPREDNISolone acetate (DEPO-MEDROL) injection 80 mg, Synovial cell count + diff, w/ crystals, Body fluid culture, CANCELED: Synovial Fluid Panel  BEHCETS SYNDROME - Plan: methylPREDNISolone acetate (DEPO-MEDROL) injection 80 mg, Synovial cell count + diff, w/ crystals, Body fluid culture, CANCELED: Synovial Fluid Panel  Recurrent effusions in a patient with a rheumatological disorder and potentially gout. Obtain synovial fluid panel.  Knee Aspiration and Injection, L Patient verbally consented; risks, benefits, and alternatives explained including possible infection. Patient prepped with Chloraprep. Ethyl chloride for anesthesia. 10 cc of 1% Lidocaine used in wheal then injected Subcutaneous fashion with 22 gauge needle on lateral approach. Under sterilne conditions, 18 gauge needle used via lateral approach to aspirate 75 cc of yellowish, mildly cloudy fluid. Then 8 cc of Lidocaine 1% and 2 mL of Depo-Medrol 40 mg injected. Tolerated well, decreased pain, no complications.   Follow-up: No Follow-up on file.  Orders Placed This Encounter  Procedures  . Body fluid culture  . Synovial cell count + diff, w/ crystals    Signed,  Mose Colaizzi T. Ziyon Soltau, MD   Patient's Medications  New Prescriptions   No medications on file  Previous Medications   COLCRYS 0.6 MG TABLET    Take 1 tablet (0.6 mg total) by mouth daily.   INDOMETHACIN (INDOCIN)  50 MG CAPSULE    Take 1 capsule (50 mg total) by mouth 3 (three) times daily with meals.  Modified Medications   No medications on file  Discontinued Medications   No medications on file

## 2015-07-25 NOTE — Progress Notes (Signed)
Pre visit review using our clinic review tool, if applicable. No additional management support is needed unless otherwise documented below in the visit note. 

## 2015-07-26 LAB — SYNOVIAL CELL COUNT + DIFF, W/ CRYSTALS
Basophils, %: 0 %
Eosinophils-Synovial: 0 % (ref 0–2)
LYMPHOCYTES-SYNOVIAL FLD: 7 % (ref 0–74)
MONOCYTE/MACROPHAGE: 4 % (ref 0–69)
Neutrophil, Synovial: 90 % — ABNORMAL HIGH (ref 0–24)
SYNOVIOCYTES, %: 0 % (ref 0–15)
WBC, SYNOVIAL: 12965 {cells}/uL — AB (ref <150)

## 2015-07-29 LAB — BODY FLUID CULTURE
Gram Stain: NONE SEEN
Organism ID, Bacteria: NO GROWTH

## 2015-12-12 IMAGING — CT CT ABD-PELV W/ CM
2 of 7 series · 17 of 46 positions shown, 19 images · IV contrast (Omnipaque 300)
Comparison: 11/23/2012

CLINICAL DATA: Lower abdominal and pelvic pain. Cramping. Status
post surgery and radiation therapy for testicular carcinoma.

EXAM:
CT ABDOMEN AND PELVIS WITH CONTRAST
TECHNIQUE: Multidetector CT imaging of the abdomen and pelvis was performed
using the standard protocol following bolus administration of
intravenous contrast.
CONTRAST:  100 mL Omnipaque 300

[Series 2: abd/ pel 5mm · axial · 0.70mm/px · z∈[-462,-38]mm · 14 of 95 slices shown, 16 images]
[im 5/95  soft-tissue]
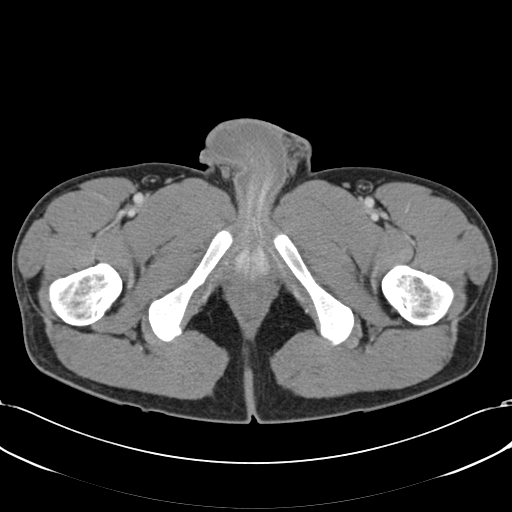
[im 5/95  bone]
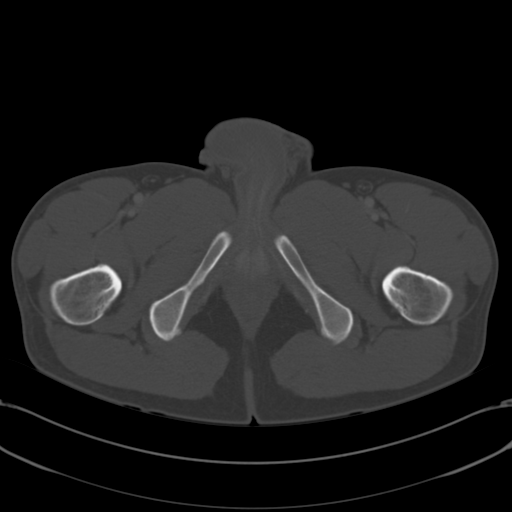
[im 15/95  soft-tissue]
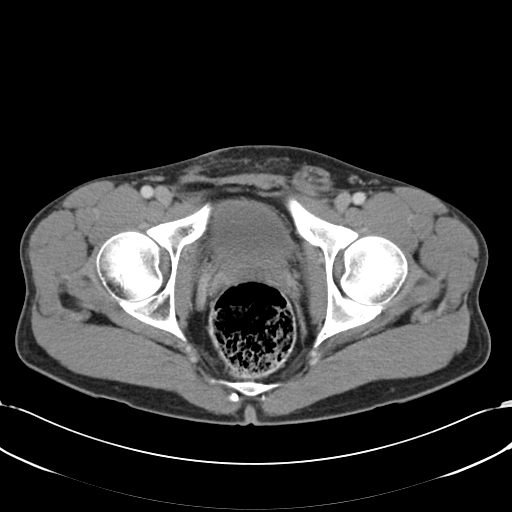
[im 19/95  soft-tissue]
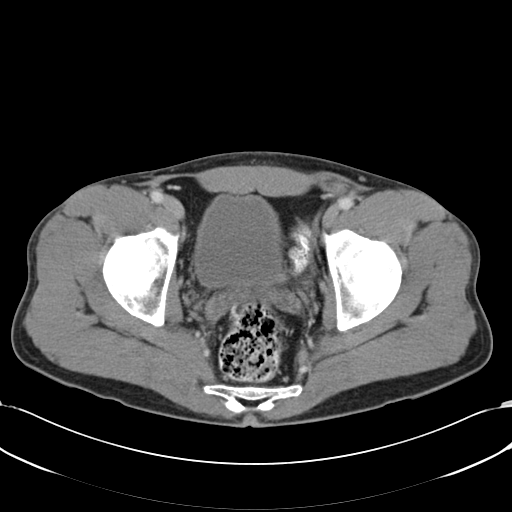
[im 24/95  soft-tissue]
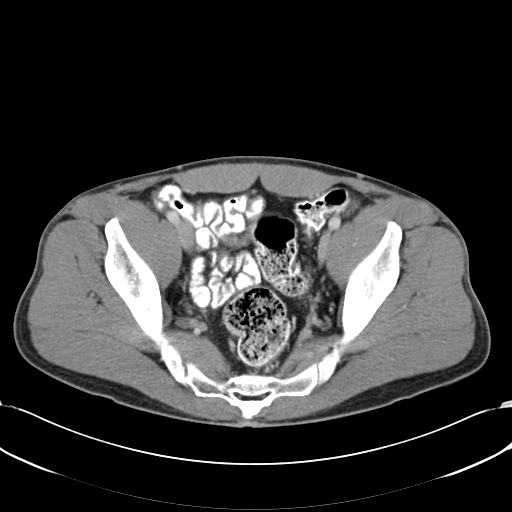
[im 33/95  soft-tissue]
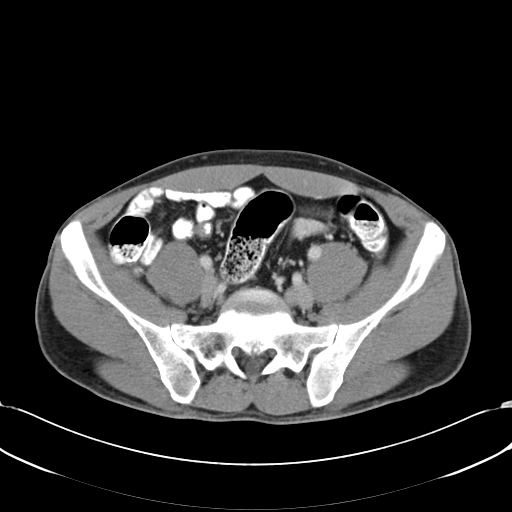
[im 38/95  soft-tissue]
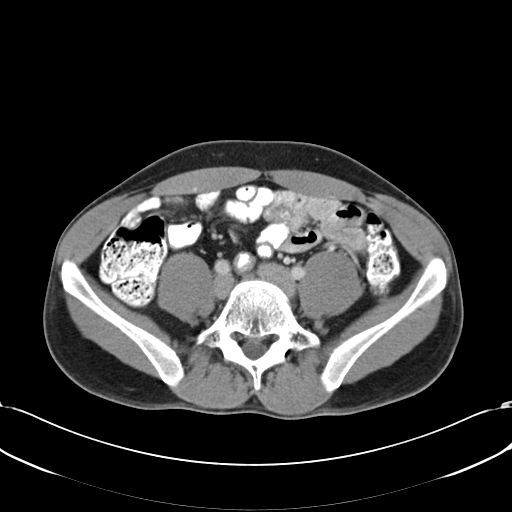
[im 43/95  soft-tissue]
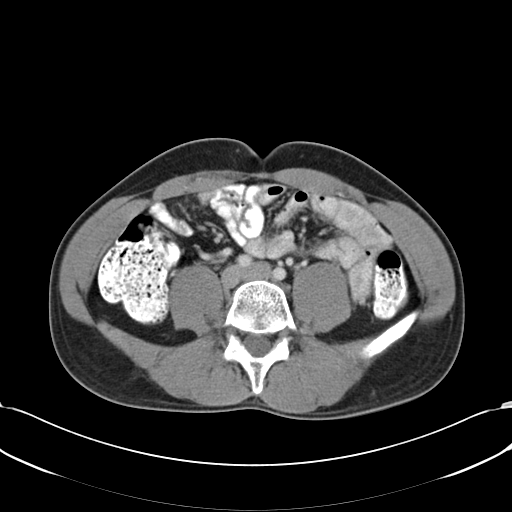
[im 52/95  soft-tissue]
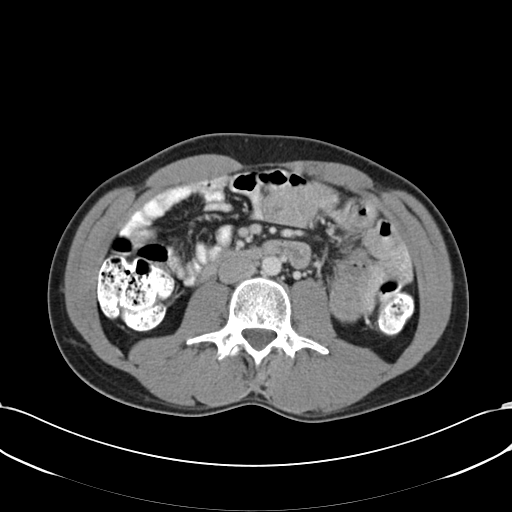
[im 57/95  soft-tissue]
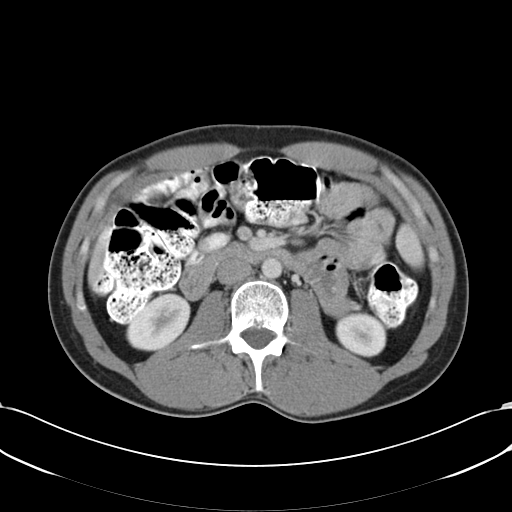
[im 57/95  bone]
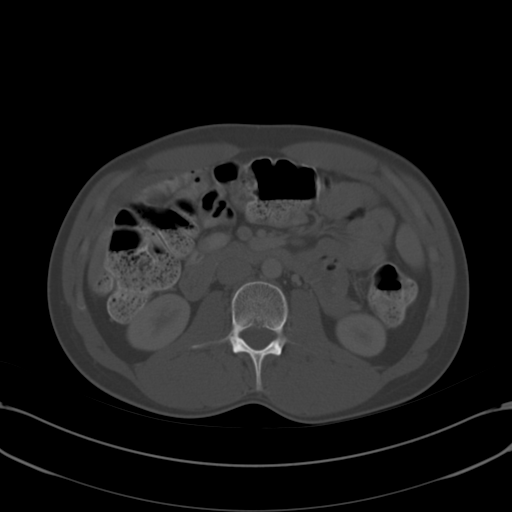
[im 62/95  soft-tissue]
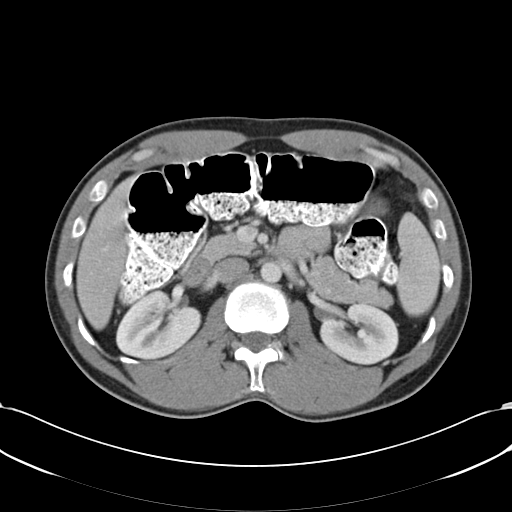
[im 71/95  soft-tissue]
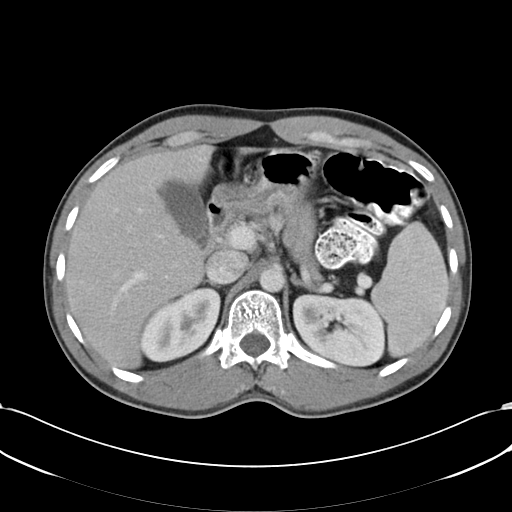
[im 76/95  soft-tissue]
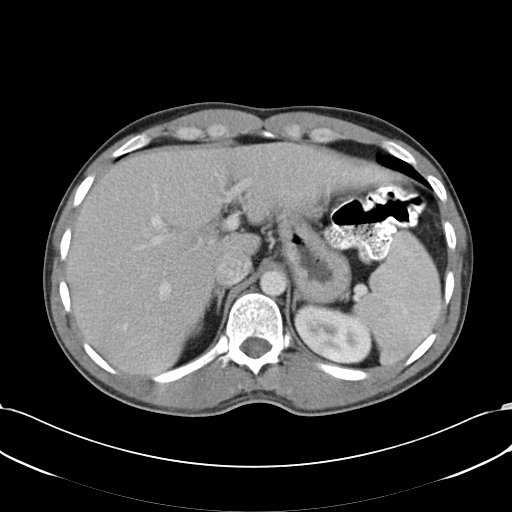
[im 80/95  soft-tissue]
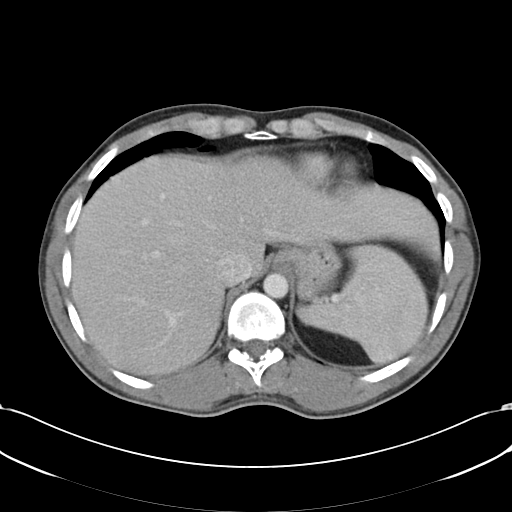
[im 90/95  soft-tissue]
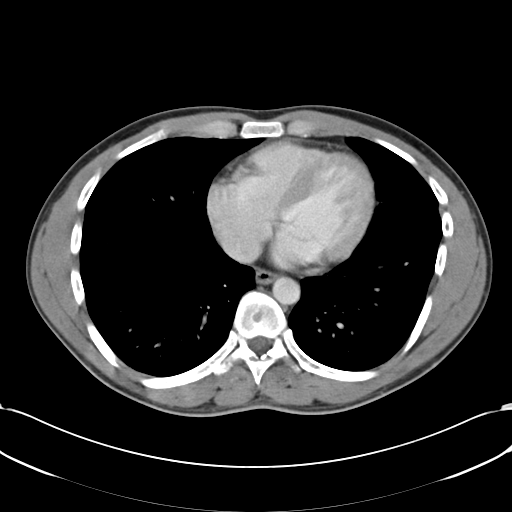

[Series 602: cor · coronal · 0.95mm/px · 3 of 99 slices shown]
[im 25/99  soft-tissue]
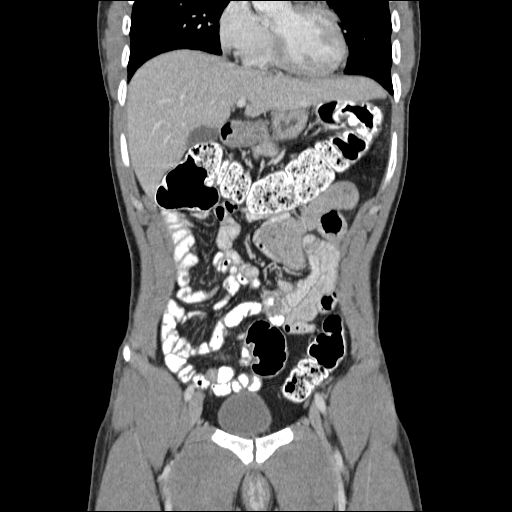
[im 50/99  soft-tissue]
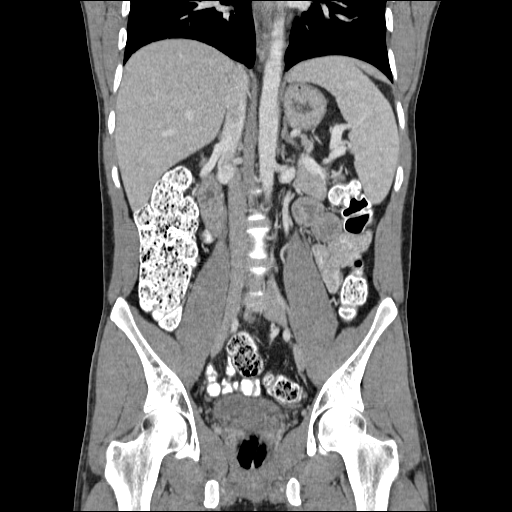
[im 74/99  soft-tissue]
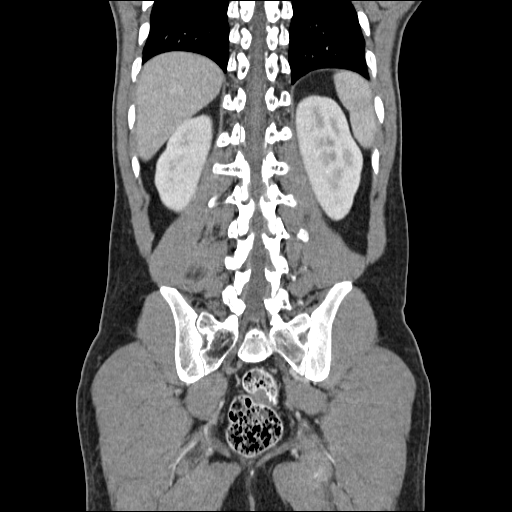

[17 of 46 positions shown; findings below may reference images not displayed]

FINDINGS: The liver, gallbladder, spleen, pancreas, adrenal glands, and
kidneys are normal in appearance. No evidence of hydronephrosis. No
evidence of soft tissue masses or lymphadenopathy within the abdomen
or pelvis.

Postop changes from right orchiectomy noted. No evidence of
inflammatory process or abnormal fluid collections. No evidence of
bowel wall thickening, dilatation, or hernia. No suspicious bone
lesions identified. Visualized portions of lung bases are clear.
IMPRESSION: Negative. No evidence of metastatic disease or other significant
abnormality.

## 2016-03-18 ENCOUNTER — Telehealth: Payer: Self-pay | Admitting: Oncology

## 2016-03-18 NOTE — Telephone Encounter (Signed)
Patient called to have annual appointment scheduled. Scheduled to next available per Haskel Schroeder.

## 2016-03-20 ENCOUNTER — Ambulatory Visit: Payer: Self-pay | Admitting: Family Medicine

## 2016-04-03 ENCOUNTER — Encounter: Payer: Self-pay | Admitting: Family Medicine

## 2016-04-03 ENCOUNTER — Ambulatory Visit (INDEPENDENT_AMBULATORY_CARE_PROVIDER_SITE_OTHER): Payer: Self-pay | Admitting: Family Medicine

## 2016-04-03 VITALS — BP 100/68 | HR 87 | Temp 98.4°F | Ht 68.0 in | Wt 162.5 lb

## 2016-04-03 DIAGNOSIS — K219 Gastro-esophageal reflux disease without esophagitis: Secondary | ICD-10-CM

## 2016-04-03 DIAGNOSIS — R1013 Epigastric pain: Secondary | ICD-10-CM

## 2016-04-03 LAB — CBC WITH DIFFERENTIAL/PLATELET
BASOS ABS: 0 10*3/uL (ref 0.0–0.1)
BASOS PCT: 0.4 % (ref 0.0–3.0)
EOS ABS: 0.1 10*3/uL (ref 0.0–0.7)
Eosinophils Relative: 0.7 % (ref 0.0–5.0)
HEMATOCRIT: 43.3 % (ref 39.0–52.0)
Hemoglobin: 14.6 g/dL (ref 13.0–17.0)
LYMPHS PCT: 15.1 % (ref 12.0–46.0)
Lymphs Abs: 1.1 10*3/uL (ref 0.7–4.0)
MCHC: 33.7 g/dL (ref 30.0–36.0)
MCV: 80.2 fl (ref 78.0–100.0)
MONO ABS: 0.4 10*3/uL (ref 0.1–1.0)
Monocytes Relative: 6 % (ref 3.0–12.0)
NEUTROS ABS: 5.7 10*3/uL (ref 1.4–7.7)
NEUTROS PCT: 77.8 % — AB (ref 43.0–77.0)
PLATELETS: 275 10*3/uL (ref 150.0–400.0)
RBC: 5.4 Mil/uL (ref 4.22–5.81)
RDW: 13.2 % (ref 11.5–15.5)
WBC: 7.4 10*3/uL (ref 4.0–10.5)

## 2016-04-03 LAB — LIPASE: Lipase: 24 U/L (ref 11.0–59.0)

## 2016-04-03 LAB — HEPATIC FUNCTION PANEL
ALT: 12 U/L (ref 0–53)
AST: 11 U/L (ref 0–37)
Albumin: 4.3 g/dL (ref 3.5–5.2)
Alkaline Phosphatase: 75 U/L (ref 39–117)
BILIRUBIN TOTAL: 1.1 mg/dL (ref 0.2–1.2)
Bilirubin, Direct: 0.2 mg/dL (ref 0.0–0.3)
TOTAL PROTEIN: 7.2 g/dL (ref 6.0–8.3)

## 2016-04-03 LAB — H. PYLORI ANTIBODY, IGG: H Pylori IgG: NEGATIVE

## 2016-04-03 NOTE — Progress Notes (Signed)
Pre visit review using our clinic review tool, if applicable. No additional management support is needed unless otherwise documented below in the visit note. 

## 2016-04-03 NOTE — Progress Notes (Signed)
Dr. Frederico Hamman T. Govanni Plemons, MD, [facility] Primary [facility] and Sports Medicine [place], [address] Phone: [phone] Fax: [phone]  04/03/2016  Patient: Daniel Vang, MRN: [id number], DOB: 17-Apr-1985, 31 y.o.  Primary Physician:  [name], MD   Chief Complaint  Patient presents with  . Gastroesophageal Reflux  . Abdominal Pain   Subjective:   Daniel Vang is a 31 y.o. very pleasant male patient who presents with the following:  Has been ongoing for about the bad month, bad heartburn. Some pain in the chest at the same time. Some abdominal pain, as well.   Coffee was making it worse. Was drinking 60-80 oz a day.   Has a lot of belching. Usually worse at night.  Started to take some digestive enzyme. That has helped.   Started some prilosec OTC. Started 3 weeks ago. Was only intermittent with this and did not take full course.   No BRBPR or melena.   Past Medical History, Surgical History, Social History, Family History, Problem List, Medications, and Allergies have been reviewed and updated if relevant.  Patient Active Problem List   Diagnosis Date Noted  . Gout 08/09/2014  . 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 [place] SYNDROME 08/31/2007  . ALLERGIC RHINITIS 04/20/2007  . SCOLIOSIS, MILD 04/20/2007    Past Medical History:  Diagnosis Date  . Behcet's syndrome ([place])    DX 2009--  NO CURRENT FLARE UP  . Cancer ([place]) 08/2012   seminoma right testis  . Gout 08/09/2014  . History of radiation therapy 09/22/2012-10/19/2012   25 Gy to periaortic and right hemipelvis  . History of thrombophlebitis    2004--  UPPER EXTREMITIY -- RESOLVED  . 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 ADENOIDECTOMY  2001  . APPENDECTOMY  AGE 89  1998  . MAXILLARY LE FORTE OSTEOTOMY AND MANDIBULAR BILATERAL SAGITTAL SPLIT RAMUS OSTEOTOMIIES WITH RIGID INTERNAL FIXATION  02-20-2004     SKELETAL DEFECT  . ORCHIECTOMY Right 08/26/2012   Procedure: RIGHT INGUINAL ORCHIECTOMY WITH TESTICULAR PROTHESIS;  Surgeon: Malka So, MD;  Location: [facility];  Service: Urology;  Laterality: Right;    Social History   Social History  . Marital status: Married    Spouse name: N/A  . Number of children: N/A  . Years of education: N/A   Occupational History  . Not on file.   Social History Main Topics  . Smoking status: Former Smoker    Packs/day: 0.25    Years: 1.00    Quit date: 06/09/2005  . Smokeless tobacco: Never Used  . Alcohol use Yes     Comment: RARE  . Drug use: No  . Sexual activity: Not on file   Other Topics Concern  . Not on file   Social History Narrative  . No narrative on file    Family History  Problem Relation Age of Onset  . Diabetes Maternal Grandmother   . Diabetes Maternal Grandfather   . Diabetes Paternal Grandmother   . Heart attack Paternal Grandfather   . Hypertension Paternal Grandfather   . Diabetes Paternal Grandfather     Allergies  Allergen Reactions  . Morphine Nausea And Vomiting and Other (See Comments)    DIZZY    Medication list reviewed and updated in full in [place].   GEN: No acute illnesses, no fevers, chills. GI: No  n/v/d, eating normally Pulm: No SOB Interactive and getting along well at home.  Otherwise, ROS is as per the HPI.  Objective:   BP 100/68   Pulse 87   Temp 98.4 F (36.9 C) (Oral)   Ht 5\' 8"  (1.727 m)   Wt 162 lb 8 oz (73.7 kg)   BMI 24.71 kg/m   GEN: WDWN, NAD, Non-toxic, A & O x 3 HEENT: Atraumatic, Normocephalic. Neck supple. No masses, No LAD. Ears and Nose: No external deformity. CV: RRR, No M/G/R. No JVD. No thrill. No extra heart sounds. PULM: CTA B, no wheezes, crackles, rhonchi. No retractions. No resp. distress. No accessory muscle use. ABD: S, mild epigastric ttp, ND, + BS, No rebound, No HSM  EXTR: No c/c/e NEURO Normal gait.  PSYCH: Normally  interactive. Conversant. Not depressed or anxious appearing.  Calm demeanor.   Laboratory and Imaging Data:  Assessment and Plan:   Abdominal pain, epigastric - Plan: H. pylori antibody, IgG, Lipase, Hepatic function panel, CBC with Differential/Platelet  Gastroesophageal reflux disease, esophagitis presence not specified  Epigastric pain concern for ulcer vs gastritis.  Check appropriate labs then direct care based on results.   Follow-up: No Follow-up on file.  Medications Discontinued During This Encounter  Medication Reason  . COLCRYS 0.6 MG tablet Error  . indomethacin (INDOCIN) 50 MG capsule Completed Course   Orders Placed This Encounter  Procedures  . H. pylori antibody, IgG  . Lipase  . Hepatic function panel  . CBC with Differential/Platelet    Signed,  Frederico Hamman T. Tajana Crotteau, MD   Allergies as of 04/03/2016      Reactions   Morphine Nausea And Vomiting, Other (See Comments)   DIZZY      Medication List    as of 04/03/2016  4:40 PM   You have not been prescribed any medications.

## 2016-04-08 ENCOUNTER — Other Ambulatory Visit: Payer: Self-pay | Admitting: Family Medicine

## 2016-04-08 MED ORDER — OMEPRAZOLE 40 MG PO CPDR: 40.0000 mg | DELAYED_RELEASE_CAPSULE | Freq: Every day | ORAL | 2 refills | Status: AC

## 2016-04-24 ENCOUNTER — Telehealth: Payer: Self-pay | Admitting: Oncology

## 2016-04-24 ENCOUNTER — Ambulatory Visit (HOSPITAL_BASED_OUTPATIENT_CLINIC_OR_DEPARTMENT_OTHER): Payer: Self-pay | Admitting: Oncology

## 2016-04-24 VITALS — BP 110/61 | HR 76 | Temp 98.3°F | Resp 18 | Ht 68.0 in | Wt 158.8 lb

## 2016-04-24 DIAGNOSIS — C6291 Malignant neoplasm of right testis, unspecified whether descended or undescended: Secondary | ICD-10-CM

## 2016-04-24 DIAGNOSIS — C6211 Malignant neoplasm of descended right testis: Secondary | ICD-10-CM

## 2016-04-24 NOTE — Progress Notes (Signed)
Hematology and Oncology Follow Up Visit  SMYAN EHRMAN [id number] 1986/01/05 31 y.o. 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 He had a T2 tumor with a 1.1 cm enlarged lymph node.   Prior Therapy:  He is status post right radical orchiectomy on 08/26/2012. He is S/P radiation therapy for a total of 25 gray in 20 fractions completed in 10/2012.   Current therapy: Observation and surveillance.  Interim History:  Mr. Schlicher presents today for a followup visit. He missed appointments in the last 2 years and have rescheduled recently to reestablish surveillance. He has been feeling reasonably well without any major complaints. He did report. Anxiety associated with excessive caffeine intake as well as dyspepsia that was treated with proton pump inhibitors. Symptoms are much improved at this time. His appetite remains same and continues to be active.  He denied any headaches, blurry vision, syncope or seizures. He does not report any fevers or chills or sweats. He does not report any cough, wheezing or hemoptysis. He is not reporting nausea, vomiting or abdominal pain. He does not report any constipation or diarrhea. He does not report any frequency urgency or hesitancy. He does not report any skeletal complaints. Remaining review of systems unremarkable.    Medications: I have reviewed the patient's current medications.  Current Outpatient Prescriptions  Medication Sig Dispense Refill  . omeprazole (PRILOSEC) 40 MG capsule Take 1 capsule (40 mg total) by mouth daily. 30 mins before breakfast 30 capsule 2   No current facility-administered medications for this visit.      Allergies:  Allergies  Allergen Reactions  . Morphine Nausea And Vomiting and Other (See Comments)    DIZZY    Past Medical History, Surgical history, Social history, and Family History were reviewed  and updated.  Review of Systems:  Remaining ROS negative. Physical Exam:  Blood pressure 110/61, pulse 76, temperature 98.3 F (36.8 C), temperature source Oral, resp. rate 18, height 5\' 8"  (1.727 m), weight 158 lb 12.8 oz (72 kg), SpO2 100 %. ECOG: 0 General appearance:Well-appearing gentleman without distress. Head: Normocephalic, without obvious abnormality, atraumatic Neck: no adenopathy Lymph nodes: Cervical, supraclavicular, and axillary nodes normal. Heart:regular rate and rhythm, S1, S2 normal, no murmur, click, rub or gallop Lung:chest clear, no wheezing, rales, normal symmetric air entry without rhonchi or dullness to percussion. Abdomin: soft, non-tender, without masses or organomegaly shifting dullness or ascites. EXT:no erythema, induration, or nodules   Lab Results: Lab Results  Component Value Date   WBC 7.4 04/03/2016   HGB 14.6 04/03/2016   HCT 43.3 04/03/2016   MCV 80.2 04/03/2016   PLT 275.0 04/03/2016     Chemistry      Component Value Date/Time   NA 141 05/17/2013 1207   K 4.0 05/17/2013 1207   CL 107 02/07/2013 1328   CO2 23 05/17/2013 1207   BUN 14.9 05/17/2013 1207   CREATININE 0.8 05/17/2013 1207      Component Value Date/Time   CALCIUM 9.6 05/17/2013 1207   ALKPHOS 75 04/03/2016 1457   ALKPHOS 72 05/17/2013 1207   AST 11 04/03/2016 1457   AST 17 05/17/2013 1207   ALT 12 04/03/2016 1457   ALT 18 05/17/2013 1207   BILITOT 1.1 04/03/2016 1457   BILITOT 1.65 (H) 05/17/2013 1207      Results for CHASTON, BUTCHART (MRN [id number]) as of 05/25/2013 14:50  Ref. Range 05/17/2013 12:07  AFP-Tumor Marker Latest Range: 0.0-8.0 ng/mL <1.3  Beta hCG, Tumor Marker Latest Range: < 5.0 mIU/mL < 2.0    Impression and Plan:  31 year old gentleman with the following issues:  1. 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 He is status post orchiectomy followed by radiation therapy. He continues to be in remission  without any symptoms to suggest recurrent disease.  His most recent CT scan in May 2016 continues to show no evidence of disease. The plan is to continue with active surveillance at this time and repeat CT scan and chest x-ray in the immediate future. If these images studies do not show any evidence of recurrent disease, he will need annual follow-ups with physical examination, laboratory testing and a chest x-ray annually.   2. Age-appropriate cancer screening: He is up-to-date at this time.  3. Follow-up: Will be in one year unless his imaging studies showed abnormalities.   [facility], MD 2/22/20188:51 AM

## 2016-04-24 NOTE — Telephone Encounter (Signed)
Appointments scheduled per 2/22 LOS. Patient given AVS report and calendars with future scheduled appointments. Two bottles of contrast and instructions given to patient for CT scan appointment.

## 2016-05-01 ENCOUNTER — Ambulatory Visit (HOSPITAL_COMMUNITY)
Admission: RE | Admit: 2016-05-01 | Discharge: 2016-05-01 | Disposition: A | Discharge status: Home / Self Care | Payer: Self-pay | Source: Ambulatory Visit | Attending: Oncology | Admitting: Oncology

## 2016-05-01 DIAGNOSIS — C6211 Malignant neoplasm of descended right testis: Secondary | ICD-10-CM

## 2016-05-01 MED ORDER — IOPAMIDOL (ISOVUE-300) INJECTION 61%
100.0000 mL | Freq: Once | INTRAVENOUS | Status: AC | PRN
  Administered 2016-05-01: 100 mL via INTRAVENOUS

## 2016-05-01 MED ORDER — IOPAMIDOL (ISOVUE-300) INJECTION 61%
INTRAVENOUS | Status: AC
  Filled 2016-05-01: qty 100

## 2016-05-02 ENCOUNTER — Telehealth: Payer: Self-pay | Admitting: *Deleted

## 2016-05-02 NOTE — Telephone Encounter (Signed)
As noted below by Dr. Shadad, I informed patient of his scan results. Patient verbalized understanding. 

## 2016-05-02 NOTE — Telephone Encounter (Signed)
-----   Message from [name], MD sent at 05/02/2016  8:47 AM EST ----- Please let him know his scan is normal.

## 2017-01-13 ENCOUNTER — Ambulatory Visit: Payer: Self-pay | Admitting: Internal Medicine

## 2017-01-28 ENCOUNTER — Ambulatory Visit: Payer: Self-pay | Admitting: Family Medicine

## 2017-04-23 ENCOUNTER — Telehealth: Payer: Self-pay | Admitting: Oncology

## 2017-04-23 NOTE — Telephone Encounter (Signed)
Patient called to cancel and will call back to reschedule °

## 2017-04-24 ENCOUNTER — Ambulatory Visit: Payer: Self-pay | Admitting: Oncology

## 2017-04-24 ENCOUNTER — Other Ambulatory Visit: Payer: Self-pay
